# Patient Record
Sex: Male | Born: 1955 | Race: White | Hispanic: No | Marital: Married | Smoking: Former smoker
Health system: Southern US, Community
[De-identification: ages and names within clinical notes are randomized; demographics above are authoritative.]

## PROBLEM LIST (undated history)

## (undated) DIAGNOSIS — IMO0002 Reserved for concepts with insufficient information to code with codable children: Secondary | ICD-10-CM

## (undated) HISTORY — DX: Reserved for concepts with insufficient information to code with codable children: IMO0002

---

## 2006-07-21 ENCOUNTER — Encounter: Admission: RE | Admit: 2006-07-21 | Discharge: 2006-07-21 | Payer: Self-pay | Admitting: Family Medicine

## 2006-07-23 ENCOUNTER — Encounter: Admission: RE | Admit: 2006-07-23 | Discharge: 2006-07-23 | Payer: Self-pay | Admitting: Family Medicine

## 2006-09-10 ENCOUNTER — Encounter: Admission: RE | Admit: 2006-09-10 | Discharge: 2006-09-10 | Payer: Self-pay | Admitting: Interventional Radiology

## 2006-12-18 ENCOUNTER — Encounter: Admission: RE | Admit: 2006-12-18 | Discharge: 2006-12-18 | Payer: Self-pay | Admitting: Interventional Radiology

## 2007-01-09 ENCOUNTER — Encounter (HOSPITAL_BASED_OUTPATIENT_CLINIC_OR_DEPARTMENT_OTHER): Admission: RE | Admit: 2007-01-09 | Discharge: 2007-04-09 | Payer: Self-pay | Admitting: Surgery

## 2008-05-30 IMAGING — US US EXTREM LOW VENOUS BILAT
1 series · 1 of 1 positions shown · non-contrast
Comparison: none

CLINICAL DATA: 50-year-old male, right ankle venous ulceration with varicosities and hyperpigmentation.  Evaluate for venous insufficiency. 
BILATERAL LOWER EXTREMITY VENOUS DOPPLER ULTRASOUND:
TECHNIQUE: Gray-scale sonography with compression, as well as color and duplex Doppler ultrasound, were performed to evaluate the deep venous system from the level of the common femoral vein through the popliteal and proximal calf veins.

[Series 1: us extrem low venous bilat · 1 of 1 slices shown]
[im 1/1]
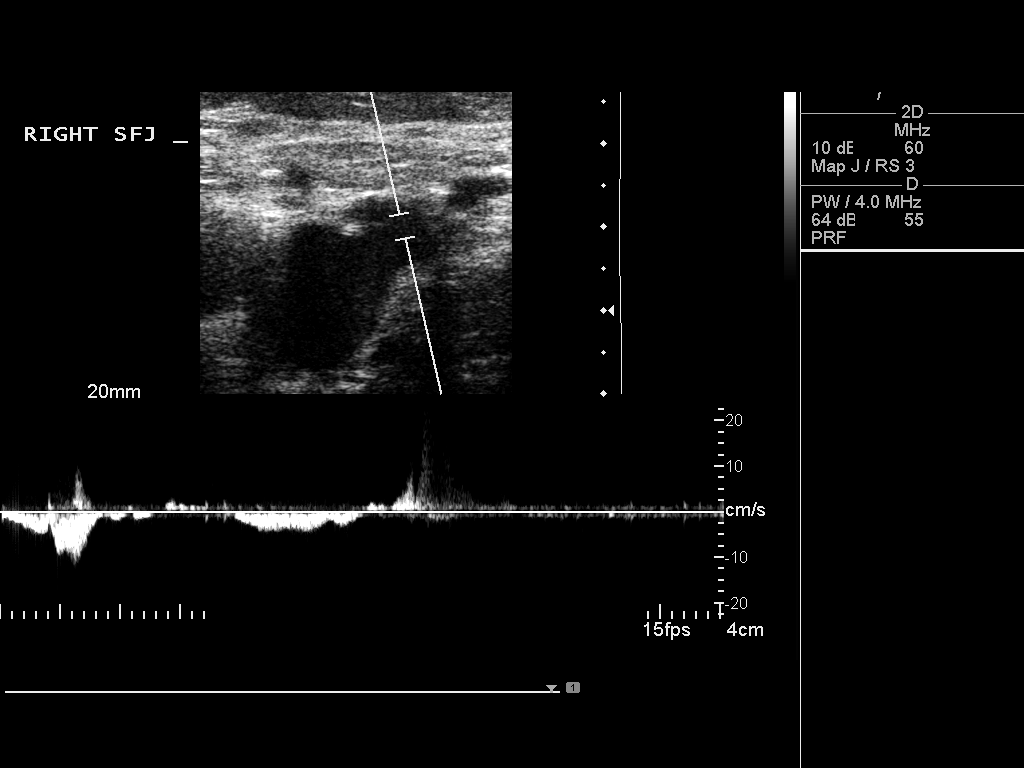

[1 of 1 positions shown; findings below may reference images not displayed]

FINDINGS: Left Lower Extremity:  The left common femoral, superficial femoral, and popliteal veins demonstrate normal compressibility and augmentation without DVT.  The left SFJ is very mildly positive with venous insufficiency.  The left GSV demonstrates mild diffuse venous insufficiency with no significant subsurface varicose veins.  The left SSV is normal.  
Right Lower Extremity:  The right common femoral, femoral, and popliteal veins demonstrate normal compressibility and augmentation without DVT.  The right SFJ is weakly positive with minimal reflux.  The right GSV is duplicated with intervening short segments of minor venous insufficiency.  In the lower calf region, there are small subsurface branching varicosities along the region of the focal punctate venous ulcer.  Hyperpigmentation is noted in this region.  Right SSV is normal.
IMPRESSION: 1.  Negative for DVT. 
2.  Very mild bilateral GSV venous insufficiency particularly in the right lower calf and ankle region correlating with the patient?s right ankle venous stasis ulcer.

## 2009-05-23 ENCOUNTER — Encounter: Admission: RE | Admit: 2009-05-23 | Discharge: 2009-06-15 | Payer: Self-pay | Admitting: Sports Medicine

## 2010-05-22 NOTE — Assessment & Plan Note (Signed)
[facility]   NAME:  Leonard Lambert, Leonard Lambert NO.:  [id number]   MEDICAL RECORD NO.:  [id number]      DATE OF BIRTH:  03-30-1955   PHYSICIAN:  Theresia Majors. Tanda Rockers, M.D.      VISIT DATE:                                   OFFICE VISIT   SUBJECTIVE:  Leonard Lambert is a 55 year old man whom we are following for a  right medial stasis ulcer.  We have treated him with compression and  Iodosorb.  There has been no interim drainage.  He has complained of  some mild pain and throbbing due to prolonged standing.  There has been  no fever.   OBJECTIVE:  Blood pressure is 141/86, respirations 16, pulse rate 81,  temperature is 98.3.  Inspection of the right medial ankle shows that the edema is well-  controlled.  The impacted Iodosorb has turned to a dull pale.  There is  no exudate.  The pedal pulses is readily palpable.  A 10 blade was used  to shave off the impacted Iodosorb.  This disclosed epithelialized base.   ASSESSMENT:  Clinical improvement of stasis ulcer.   PLAN:  We have instructed the patient to utilize his bilateral 30-40 mm  below-the-knee compression hose, continue to use Iodosorb if there is  any moisture, but to modify his dressing change to use Iodosorb every 3  days.  We will reevaluate him in 2 weeks p.r.n.  He will call the clinic  sooner if he experiences intense swelling, pain, fever or drainage.  We  have given him an opportunity to ask questions.  He seems to understand  indicates that he will be compliant.      Harold A. Tanda Rockers, M.D.  Electronically Signed     HAN/MEDQ  D:  02/23/2007  T:  02/23/2007  Job:  [id number]

## 2010-05-22 NOTE — Consult Note (Signed)
NAME:  Leonard Lambert, GUNNING NO.:  [id number]   MEDICAL RECORD NO.:  [id number]          PATIENT TYPE:  REC   LOCATION:  FOOT                         FACILITY:  [facility]   PHYSICIAN:  Theresia Majors. Tanda Rockers, M.D.DATE OF BIRTH:  1955/06/02   DATE OF CONSULTATION:  01/12/2007  DATE OF DISCHARGE:                                 CONSULTATION   REASON FOR CONSULTATION:  Leonard Lambert is a 55 year old man referred by Dr.  Venita Lick. Shick for evaluation of a venous stasis ulcer involving his right  medial leg.   IMPRESSION:  Stasis ulceration.   RECOMMENDATIONS:  Unna boot protocol followed by bilateral 30-40 mm open  toed compression hose.   SUBJECTIVE:  Leonard Lambert is a 55 year old male who has been followed for  the last 6 months for recurrent ulceration involving the medial aspect  of his right lower extremity.  He has had occasion for drainage, but no  malodor.  There has been mild redness and moderate pain prohibiting him  from free ambulation at the workplace.  There has been no recognized  trauma.  He has not been treated with antibiotics.  He has worn a  compressive hose that is approximately 68 months old.   PAST MEDICAL HISTORY:  Remarkable for the absence of medications.   ALLERGIES:  He denies allergies.   PAST SURGICAL HISTORY:  He denies previous surgery.   FAMILY HISTORY:  His family history is negative for diabetes,  hypertension, stroke and heart attack.  His father did have ulcers on  the lower extremities attributed to his veins.   SOCIAL HISTORY:  Socially, the patient is employed.  He lives in the  [place] area.  He has one adult daughter.  The patient has never  smoked.   REVIEW OF SYSTEMS:  His weight has been stable.  A 12-point, systems  review is completely negative.   PHYSICAL EXAMINATION:  VITAL SIGNS:  Blood pressure is 145/85,  respirations 16, pulse rate 72, temperature 98.3.  HEENT:  Exam is clear.  NECK:  The neck is supple.  Trachea is midline.   Thyroid is nonpalpable.  LUNGS:  Clear.  HEART:  The heart sounds were normal.  ABDOMEN:  The abdomen is soft.  EXTREMITIES:  Extremity exam is remarkable for bilateral edema 2+ on the  right, 1+ on the left.  There are prominent reticular varicosities with  mild ropiness in the greater saphenous distribution.  There are chronic  changes of hyperpigmentation and lipodermal sclerosis.  There is a well-  formed ulceration in the gaiter area on the medial aspect of the right  lower extremity.  There is a crusty exudate.  There is no ascending  cellulitis or lymphangitis.  The dorsalis pedis pulse is 3+ bilaterally.  The patient is Harris II.  NEUROLOGICALLY:  There is some decreased  sensation to the Semmes-Weinstein filament on the left volar foot.   DISCUSSION:  Leonard Lambert has a classic stasis ulcer.  We will treat him  with Unna boot protocol.  We have given him a prescription for bilateral  below-the-knee open  toed compression hose.  We anticipate that he should  experience complete resolution in 2-3 weeks and should do well with the  wearing of the appropriately fitted hose.  We have discussed his  diagnosis, his chronic management.  We have also discussed progression  of his disease which may require endovascular or open surgery.  We have  given the patient opportunity to ask questions.  He seems to understand,  expresses gratitude for having been seen in the clinic.      Harold A. Tanda Rockers, M.D.  Electronically Signed     HAN/MEDQ  D:  01/12/2007  T:  01/12/2007  Job:  [id number]   cc:   M. Trevor Shick  Caryn Bee L. Little, M.D.  Pam Drown, M.D.

## 2010-05-22 NOTE — Assessment & Plan Note (Signed)
[facility]   NAME:  MOISHY, LADAY NO.:  [id number]   MEDICAL RECORD NO.:  [id number]      DATE OF BIRTH:  Jun 26, 1955   PHYSICIAN:  Theresia Majors. Tanda Rockers, M.D. VISIT DATE:  01/26/2007                                   OFFICE VISIT   SUBJECTIVE:  Leonard Lambert is a 56 year old man who we are treating for a  stasis ulcer.  In the interim, we placed him in an Radio broadcast assistant.  The  patient reports that he removed the Unna boot 3 days ago  because it  became too tight.   OBJECTIVE:  VITAL SIGNS:  Blood pressure is 137/82, respirations 76,  temperature is not recorded, pulse rate of 82.  EXTREMITIES:  Inspection of the right medial ankle shows that there has  been contraction of the ulcer.  There is persistent 2+ edema with  chronic changes of stasis.  The pedal pulse is 3+.  There is no evidence  of wrap injury.  The wound was photographed.  Please refer to the data  entry.   ASSESSMENT:  Clinical improvement of stasis ulcer.   PLAN:  We will treat we are substituting a Profore wrap with  triamcinolone cream in the place of the Unna wrap.  We have reemphasized  the necessity for compliance.  We have encouraged the patient to remove  the wrap if it becomes excessively tight.  The patient was given an  opportunity to ask questions.  He seems to understand indicates that he  will be compliant.  We will reevaluate him in 1 week.      Harold A. Tanda Rockers, M.D.  Electronically Signed     HAN/MEDQ  D:  01/26/2007  T:  01/26/2007  Job:  [id number]

## 2010-05-22 NOTE — Assessment & Plan Note (Signed)
[facility]   NAME:  Leonard Lambert, Leonard Lambert NO.:  [id number]   MEDICAL RECORD NO.:  [id number]      DATE OF BIRTH:  Apr 13, 1955   PHYSICIAN:  Theresia Majors. Tanda Rockers, M.D. VISIT DATE:  02/27/2007                                   OFFICE VISIT   SUBJECTIVE:  Leonard Lambert returns for follow-up of a stasis ulcer on his  medial right ankle.  We have treated him with compression wrap.  He  continues to be ambulatory.  There has been no excessive pain, malodor  or fever.   OBJECTIVE:  VITALS:  Blood pressure is 145/83, respirations 16, pulse  rate 70, temperature 98.3.  Inspection of the right medial ankle shows  that the ulcer has decreased in area and volume.  There is no evidence  of excessive erythema.  There is no frank infection, abscess or  ischemia.  Compression has been satisfactory.   ASSESSMENT:  Clinical response external compression.   PLAN:  We will apply topical iodosorb and a Profore wrap and reevaluate  the patient in 1 week.      Harold A. Tanda Rockers, M.D.  Electronically Signed     HAN/MEDQ  D:  02/09/2007  T:  02/09/2007  Job:  [id number]

## 2010-05-22 NOTE — Assessment & Plan Note (Signed)
[facility]   NAME:  Leonard Lambert, Leonard Lambert NO.:  [id number]   MEDICAL RECORD NO.:  [id number]      DATE OF BIRTH:  [birth year]   PHYSICIAN:  Theresia Majors. Tanda Lambert, M.D. VISIT DATE:  01/19/2007                                   OFFICE VISIT   SUBJECTIVE:  Leonard Lambert is a 55 year old man who we are treating for  stasis ulcer involving the medial malleolus of the right lower  extremity.  In the interim the patient has modified the wrap by taking  it off, applying topical antibiotic and reapplying it.  He continues to  be ambulatory. There has been no fever, excessive drainage or malodor.   OBJECTIVE:  VITALS:  Blood pressure is 141/80, respirations 16, pulse  rate 82, temperature is 98.3.  EXTREMITIES:  Inspection of the lower extremity shows that the edema is  reasonably controlled as manifested by lineal wrinkles in the skin.  The  wrap has been inappropriately applied in the gaiter area and there is  significant constriction without full-thickness injury.  The pedal pulse  remains readily palpable.  There is no evidence of a ascending  infection.  The wound itself shows significant decrease in diameter.  No  debridement is needed.   ASSESSMENT:  Clinical improvement.   PLAN:  We have instructed the patient in the use of the compression wrap  therapy.  We have specifically advised him against disturbing in the  wrap except as outlined in his initial visit.  The wrap is to be  nonpainful, it is feel as tight as a firm handshake.  If he experiences  undue tightness, he is to elevate his leg for 15 minutes and if it the  tightness and discomfort is not completely resolved, he is to completely  remove the wrap, call the clinic for an appointment.  He has been  specifically advised against modifying the wrap and reapply it as this  is fraught with the possibility of wrap injury and worsening the  ulceration.  He has not procured his 30-40 mm below-the-knee  support  hose.  We have confirmed that he does have the prescription.  We have  instructed him to bring the hose with him during his next visit as we  anticipate he should be completely resolved.  We have given the patient  opportunity to ask questions.  He seems to understand and indicates that  he will be compliant.  We will reevaluate him in 1 week.      Leonard Lambert, M.D.  Electronically Signed     HAN/MEDQ  D:  01/19/2007  T:  01/19/2007  Job:  [id number]

## 2010-05-22 NOTE — Assessment & Plan Note (Signed)
[facility]   NAME:  Leonard, Lambert NO.:  [id number]   MEDICAL RECORD NO.:  [id number]      DATE OF BIRTH:  [birth year]   PHYSICIAN:  Theresia Majors. Tanda Lambert, M.D. VISIT DATE:  03/17/2007                                   OFFICE VISIT   SUBJECTIVE:  Leonard Lambert is a 55 year old man who has been followed in the  Wound Center for stasis ulcer involving his right medial ankle.  In the  interim we have treated him with sequential debridements and  compression.  He returns for followup.  There has been no interim  drainage malodor pain or fever.   OBJECTIVE:  Blood pressure is 134/94, respirations 16, pulse rate 71,  temperature is 98.9.  The inspection of the right medial ankle shows that the wound is 100%  resolved.  There is no evidence of residual.  There is good perfusion.  There is +2 edema of the right lower extremity associated with chronic  changes of stasis.   ASSESSMENT:  Satisfactory resolution of stasis ulcer.   PLAN:  We will discharge the patient from active management in the Wound  Center.  We have encouraged him to procure and wear bilateral below-the-  knee 30-40 mm open toed compression hose.   The patient is discharged.      Leonard Lambert, M.D.  Electronically Signed     HAN/MEDQ  D:  03/17/2007  T:  03/18/2007  Job:  [id number]   cc:   M. Trevor Shick  Caryn Bee L. Little, M.D.

## 2010-05-22 NOTE — Assessment & Plan Note (Signed)
[facility]   NAME:  Leonard Lambert, POPPER NO.:  [id number]   MEDICAL RECORD NO.:  [id number]      DATE OF BIRTH:  05/27/1955   PHYSICIAN:  Theresia Majors. Tanda Rockers, M.D. VISIT DATE:  02/03/2007                                   OFFICE VISIT   SUBJECTIVE:  Mr. Rowser is a 55 year old man whom we are following for  stasis ulcer.  In the interim we have treated him with a Profore wrap,  triamcinolone ointment and elevation.  He continues to be ambulatory.  There has been no interim fever, excessive drainage malodor or pain.   OBJECTIVE:  Blood pressure is 145/90, respirations 18, pulse rate 73,  temperature 98.5.  Inspection of the right medial ankle shows that the ulcer has a moderate  amount of desquamation and necrotic tissue. Under EMLA block an  excisional debridement of necrotic tissue, desquamation, and a rim of  subcutaneous tissue was excised disclosing a healthy wound underneath.  Hemorrhage was controlled with direct pressure.  The patient was  returned to a Profore wrap.   ASSESSMENT:  Clinical improvement of stasis ulcer adequately debrided.   PLAN:  We will continue compression wrap therapy and reevaluate the  patient in 1 week.      Harold A. Tanda Rockers, M.D.  Electronically Signed     HAN/MEDQ  D:  02/03/2007  T:  02/03/2007  Job:  [id number]

## 2010-05-22 NOTE — Assessment & Plan Note (Signed)
[facility]   NAME:  STELLA, ENCARNACION NO.:  [id number]   MEDICAL RECORD NO.:  [id number]      DATE OF BIRTH:  1955/03/24   PHYSICIAN:  Theresia Majors. Tanda Rockers, M.D. VISIT DATE:  02/16/2007                                   OFFICE VISIT   SUBJECTIVE:  Mr. Witkop is a 55 year old man who we are following for  stasis ulcer. In the interim we have treated him with an Unna wrap and  iodosorb gel topically.  There has been no pain, fever or malodor.  He  continues to be ambulatory.   OBJECTIVE:  VITALS:  Blood pressure is 136/87, respirations 18, pulse  rate 69, temperature 97.9.  SKIN:  Inspection of the wound shows that there is significant decrease  in area and volume.  The impacted iodosorb was removed disclosing  advancing epithelium.  The pedal pulse remains palpable.   ASSESSMENT:  Clinically improved stasis ulcer.   PLAN:  We will return the patient to an Radio broadcast assistant with iodosorb.  We  will reevaluate him in 1 week.      Harold A. Tanda Rockers, M.D.  Electronically Signed     HAN/MEDQ  D:  02/16/2007  T:  02/16/2007  Job:  [id number]

## 2010-07-04 ENCOUNTER — Encounter (HOSPITAL_BASED_OUTPATIENT_CLINIC_OR_DEPARTMENT_OTHER): Payer: Managed Care, Other (non HMO) | Attending: Plastic Surgery

## 2010-07-04 DIAGNOSIS — I872 Venous insufficiency (chronic) (peripheral): Secondary | ICD-10-CM | POA: Insufficient documentation

## 2010-07-04 DIAGNOSIS — I8390 Asymptomatic varicose veins of unspecified lower extremity: Secondary | ICD-10-CM | POA: Insufficient documentation

## 2010-07-04 DIAGNOSIS — L97809 Non-pressure chronic ulcer of other part of unspecified lower leg with unspecified severity: Secondary | ICD-10-CM | POA: Insufficient documentation

## 2010-07-04 DIAGNOSIS — I739 Peripheral vascular disease, unspecified: Secondary | ICD-10-CM | POA: Insufficient documentation

## 2010-07-05 NOTE — Assessment & Plan Note (Signed)
[facility]  NAME:  Leonard Lambert, Leonard Lambert NO.:  [id number]  MEDICAL RECORD NO.:  [id number]      DATE OF BIRTH:  1955-07-03  PHYSICIAN:  Wayland Denis, DO       VISIT DATE:  07/04/2010                                  OFFICE VISIT   Mr. Leonard Lambert is a 55 year old white male who is here for evaluation of a right lower extremity ulcer.  He was seen here several years ago and has long-term venous insufficiency.  The wound was in the same place and healed up.  He has been on his legs a lot working which may have contributed to the skin breakdown at this time.  His medical history is not significantly changed and he has been using a little antibiotic ointment with some sort of a cleaning solution without improvement.  He was concerned that it might get worse, so call for another visit.  PAST MEDICAL HISTORY:  Known peripheral vascular disease.  MEDICATIONS:  None.  ALLERGIES:  None.  SURGICAL HISTORY:  None significant.  REVIEW OF SYSTEMS:  He denies any significant change in his weight, vision, hearing, difficulty breathing, chest pain or significant change in his social or mental status.  On exam; he is alert, oriented, and cooperative in no acute distress. He is a very pleasant.  His pupils are equal and reactive.  His extraocular muscles are intact.  No cervical lymphadenopathy.  His lungs are clear.  His heart is regular.  His abdomen is soft.  He does have good peripheral pulses, but very bad varicose veins throughout lower extremities.  The ulcers on the right medial aspect close to his medial malleolus, it does not appear to be infected but has some fibrous tissue.  Further description noted in the nurse's notes.  ASSESSMENT:  Right lower extremity ulcer.  PLAN:  For vascular studies.  Lab work to see his prealbumin level. Recommend elevation, compression and Santyl daily with shower.  He is going on vacation for a week, so he did not  really want an Barista.  I gave him a script for the [facility] and we will see him back in followup in 2 weeks    Wayland Denis, DO    CS/MEDQ  D:  07/04/2010  T:  07/05/2010  Job:  [id number]

## 2010-07-18 ENCOUNTER — Encounter (HOSPITAL_BASED_OUTPATIENT_CLINIC_OR_DEPARTMENT_OTHER): Payer: Managed Care, Other (non HMO) | Attending: Plastic Surgery

## 2010-07-18 DIAGNOSIS — I739 Peripheral vascular disease, unspecified: Secondary | ICD-10-CM | POA: Insufficient documentation

## 2010-07-18 DIAGNOSIS — L97809 Non-pressure chronic ulcer of other part of unspecified lower leg with unspecified severity: Secondary | ICD-10-CM | POA: Insufficient documentation

## 2010-07-18 DIAGNOSIS — I8390 Asymptomatic varicose veins of unspecified lower extremity: Secondary | ICD-10-CM | POA: Insufficient documentation

## 2010-07-18 DIAGNOSIS — I872 Venous insufficiency (chronic) (peripheral): Secondary | ICD-10-CM | POA: Insufficient documentation

## 2010-07-18 NOTE — Progress Notes (Signed)
[facility]  NAME:  Leonard Lambert, Leonard Lambert NO.:  [id number]  MEDICAL RECORD NO.:  [id number]      DATE OF BIRTH:  1955/04/19  PHYSICIAN:  Wayland Denis, DO            VISIT DATE:                                  OFFICE VISIT   Leonard Lambert is a 55 year old white male with a right lower extremity ulcer. He does not have diabetes.  He has been using Santyl and is improving. He still has some swelling but the wound is not deep or large.  No sign of infection.  Overall he is pleased with the progress.  He went to the beach and got fair bit of sun on his lower extremities but there has been no change in his social or medical history to date.  PHYSICAL EXAMINATION:  GENERAL:  He is alert and oriented, cooperative in no acute distress. EYES:  His pupils were equal and reactive. Extraocular muscles are intact. CERVICAL:  No lymphadenopathy. LUNGS:  Clear. HEART:  Regular. ABDOMEN:  Soft.  Lower extremity wound as described.  We will continue with the Santyl, add collagen, continue with elevation and we will get him some new stockings, so he can continue with compression.     Wayland Denis, DO     CS/MEDQ  D:  07/18/2010  T:  07/18/2010  Job:  [id number]

## 2010-07-19 ENCOUNTER — Encounter (INDEPENDENT_AMBULATORY_CARE_PROVIDER_SITE_OTHER): Payer: Managed Care, Other (non HMO)

## 2010-07-19 DIAGNOSIS — L97509 Non-pressure chronic ulcer of other part of unspecified foot with unspecified severity: Secondary | ICD-10-CM

## 2010-07-25 NOTE — Progress Notes (Signed)
[facility]  NAME:  Leonard Lambert, Leonard Lambert NO.:  [id number]  MEDICAL RECORD NO.:  [id number]      DATE OF BIRTH:  [birth year]  PHYSICIAN:  Wayland Denis, DO       VISIT DATE:  07/25/2010                                  OFFICE VISIT   Leonard Lambert is a 55 year old white male here for followup on his right lower extremity wound that is located on the medial aspect of right malleolus.  He has been using Santyl.  He did note for vascular studies, which showed deep vein insufficiency.  There is some not whole lot of a change, little improvement.  The swelling has improved.  He has been wearing compression.  He has not started his multivitamins.  There has been no change in his medications or review of systems.  PHYSICAL EXAMINATION:  GENERAL:  He is alert, oriented, and cooperative, in no acute distress.  He is very pleasant. HEENT:  Pupils are equal.  Extraocular muscles are intact. NECK:  No cervical lymphadenopathy. LUNGS:  Clear. HEART:  Regular. ABDOMEN:  Soft. EXTREMITIES:  He still has some swelling in his leg.  The wound is small, very slight improvement.  No sign of infection.  He does have some tenderness just superior and posterior to the wound which is related to his pain and I can feel the difference.  We will send him for a consult to be sure there is anything that we can do about these wounds and we will see him back in a week.  He will continue with the Santyl.     Wayland Denis, DO     CS/MEDQ  D:  07/25/2010  T:  07/25/2010  Job:  [id number]

## 2010-07-31 NOTE — Procedures (Unsigned)
DUPLEX DEEP VENOUS EXAM - LOWER EXTREMITY  INDICATION:  Right lower extremity ulcer.  HISTORY:  Edema:  Yes. Trauma/Surgery:  No. Pain:  No. PE:  No. Previous DVT:  No. Anticoagulants:  No. Other:  DUPLEX EXAM:               CFV   SFV   PopV  PTV    GSV               R  L  R  L  R  L  R   L  R  L Thrombosis    o  o  o  o  o  o  o   o  o  o Spontaneous   +  +  +  +  +  +  +   +  +  + Phasic        +  +  +  +  +  +  +   +  +  + Augmentation  +  +  +  +  +  +  +   +  +  + Compressible  +  +  +  +  +  +  +   +  +  + Competent     o  o  o  o  o  o  o   o  o  o  Legend:  + - yes  o - no  p - partial  D - decreased  IMPRESSION: 1. No evidence of deep venous thrombosis. 2. Positive for clinically significant deep vein insufficiency.   _____________________________ Fransisco Hertz, MD  EM/MEDQ  D:  07/19/2010  T:  07/19/2010  Job:  [id number]

## 2010-08-01 NOTE — Progress Notes (Signed)
[facility]  NAME:  Leonard Lambert, Leonard Lambert                       ACCOUNT NO.:  MEDICAL RECORD NO.:  [id number]      DATE OF BIRTH:  1955-03-28  PHYSICIAN:  Wayland Denis, DO            VISIT DATE:                                  OFFICE VISIT   Leonard Lambert is a 55 year old white male who is here for followup on his right medial ankle who has been using Santyl.  There has not been much change.  He has been good about the compression hose, which helped with the swelling some, but he is a little discourage that there has been no significant change in the size of the wound.  It is no worse, but not any better.  There has been no change in his medications or social history.  REVIEW OF SYSTEMS:  Negative.  PHYSICAL EXAMINATION:  GENERAL:  He is alert, oriented, and cooperative. HEENT:  His pupils are equal.  Extraocular muscles are intact. NECK:  No cervical lymphadenopathy. LUNGS:  Clear. HEART:  Regular. ABDOMEN:  Soft. EXTREMITIES:  His lower extremity wound is not significantly changed.  We are going to add an Unna boot to the [place] and I have instructed him to let us know if it gets loose, but otherwise we will see him back in 1 week.     Wayland Denis, DO     CS/MEDQ  D:  08/01/2010  T:  08/01/2010  Job:  [id number]

## 2010-08-08 ENCOUNTER — Encounter (HOSPITAL_BASED_OUTPATIENT_CLINIC_OR_DEPARTMENT_OTHER): Payer: Managed Care, Other (non HMO) | Attending: Plastic Surgery

## 2010-08-08 DIAGNOSIS — L97309 Non-pressure chronic ulcer of unspecified ankle with unspecified severity: Secondary | ICD-10-CM | POA: Insufficient documentation

## 2010-08-08 DIAGNOSIS — L97809 Non-pressure chronic ulcer of other part of unspecified lower leg with unspecified severity: Secondary | ICD-10-CM | POA: Insufficient documentation

## 2010-08-08 NOTE — Progress Notes (Signed)
[facility]  NAME:  Leonard Lambert, Leonard Lambert NO.:  [id number]  MEDICAL RECORD NO.:  [id number]      DATE OF BIRTH:  12-03-1955  PHYSICIAN:  Wayland Denis, DO            VISIT DATE:                                  OFFICE VISIT   Leonard Lambert has a right leg ulcer that we have been treating with Santyl and Unna boot.  He has actually had some improvement, but it has been mild.  The vascular studies again showed deep venous insufficiency. There has been no change in his medications or social history and no recent illness.  PHYSICAL EXAMINATION:  GENERAL:  He is alert and oriented, cooperative, in no acute distress. HEENT:  His pupils are equal and reactive.  Extraocular muscles are intact. LYMPH:  No cervical lymphadenopathy. LUNGS:  Clear. HEART:  Regular.  Wound mild improvement.  No significant closure, veins prominent, specific size noted in chart.  Plan for Prisma, a Tielle with stocking at home and consult for further evaluation on his deep system.     Wayland Denis, DO     CS/MEDQ  D:  08/08/2010  T:  08/08/2010  Job:  [id number]

## 2010-08-22 NOTE — Progress Notes (Signed)
[facility]  NAME:  EVANGELOS, PAULINO NO.:  [id number]  MEDICAL RECORD NO.:  [id number]      DATE OF BIRTH:  1955/12/21  PHYSICIAN:  Wayland Denis, DO       VISIT DATE:  08/22/2010                                  OFFICE VISIT   Mr. Golladay is a 55 year old white male here for follow up on the right medial ankle ulcer.  He has been using Prisma TL with a stocking.  He has had very good improvement and the actual ulcer is nearly gone with a pinpoint opening that is very superficial.  Just superior to that, he has some cracking that is tender likely due to the dryness and he wanted to be sure there not was something more he should be doing about this. He has not had any fevers or sign of infection.  There is no change in his medications.  On exam, he is alert, oriented, and cooperative in no acute distress. He is pleasant.  Pupils are equal.  Extraocular muscles are intact.  His breathing is unlabored.  Heart is regular.  The wounds as described and no significant change from previously, it is noted in the nurse's note.  We will continue with the stocking, but add SilverCel and an alginate with moisturizer for the rest of the leg and have him follow up as needed.  He is in agreement with this plan.     Wayland Denis, DO     CS/MEDQ  D:  08/22/2010  T:  08/22/2010  Job:  [id number]

## 2010-08-23 ENCOUNTER — Encounter: Payer: Self-pay | Admitting: Vascular Surgery

## 2010-08-24 ENCOUNTER — Encounter: Payer: Self-pay | Admitting: Vascular Surgery

## 2010-08-28 ENCOUNTER — Ambulatory Visit (INDEPENDENT_AMBULATORY_CARE_PROVIDER_SITE_OTHER): Payer: Managed Care, Other (non HMO) | Admitting: Vascular Surgery

## 2010-08-28 ENCOUNTER — Other Ambulatory Visit (INDEPENDENT_AMBULATORY_CARE_PROVIDER_SITE_OTHER): Payer: Managed Care, Other (non HMO)

## 2010-08-28 ENCOUNTER — Encounter: Payer: Self-pay | Admitting: Vascular Surgery

## 2010-08-28 VITALS — BP 155/86 | HR 77 | Resp 20 | Ht 73.0 in | Wt 205.0 lb

## 2010-08-28 DIAGNOSIS — L97919 Non-pressure chronic ulcer of unspecified part of right lower leg with unspecified severity: Secondary | ICD-10-CM

## 2010-08-28 DIAGNOSIS — I83219 Varicose veins of right lower extremity with both ulcer of unspecified site and inflammation: Secondary | ICD-10-CM

## 2010-08-28 DIAGNOSIS — I83009 Varicose veins of unspecified lower extremity with ulcer of unspecified site: Secondary | ICD-10-CM

## 2010-08-28 NOTE — Progress Notes (Signed)
Subjective:     Patient ID: Leonard Lambert, male   DOB: [birth year], 55 y.o.   MRN: [id number]  HPI this 55 year old male was referred for a stasis ulcer in the right ankle. He has been treated at the [facility] long wound center for the past 6 weeks. This has included an Radio broadcast assistant treatment. He had a previous ulcer 2 years ago which was successfully treated with an Radio broadcast assistant. He has no history of DVT, thrombophlebitis, bleeding, or bulging varicose veins. He did develop darkening of the skin in the right ankle several years ago which has progressed. He wears short leg plastic compression stockings frequently. He has minimal symptoms in the contralateral left leg. Both legs have pain aching and throbbing as the day goes by worse right than left.  Past Medical History  Diagnosis Date  . Ulcer     History  Substance Use Topics  . Smoking status: Not on file  . Smokeless tobacco: Not on file  . Alcohol Use:     No family history on file.  No Known Allergies  No current outpatient prescriptions on file.  Filed Vitals:   08/28/10 1106  Height: 6\' 1"  (1.854 m)  Weight: 205 lb (92.987 kg)    Body mass index is 27.05 kg/(m^2).        Review of Systems patient denies any chest pain dyspnea on exertion PND orthopnea chronic bronchitis lower extremity claudication or other symptoms. His review of systems is completely negative in all systems.     Objective:   Physical Exam blood pressure 1 majority 6 heart rate 77 respirations 20 General he is well-developed well-nourished male no apparent distress alert and oriented x3 HEENT exam is normal for age EOMs intact Chest negative for wheezing or rhonchi Cardiovascular regular rhythm no murmurs carotid pulses 3+ no bruits Abdomen soft nontender no masses Skin has 2 discrete ulcers in the right lower third of the right calf near the medial malleolus. One measures about 2 cm in diameter and the more proximal one about 1 cm in diameter. There is  hyperpigmentation over the lower third of the medial ankle with thickening of the skin. There is 1+ edema in the right ankle. There are multiple reticular and spider veins in the lower leg on the right more so than the left. There is 3+ femoral popliteal and dorsalis pedis pulses palpable bilaterally.     Assessment:     Today I ordered a venous duplex exam of the right leg which are reviewed and interpreted. There is reflux in the deep system throughout. There is reflux in the great saphenous system from the mid calf to the saphenofemoral junction. There is reflux in the right small saphenous vein. This patient has a classic venous stasis ulcer secondary to a combination of deep and superficial venous reflux. This is a recurrent ulcer which was sealed successfully 2 years ago. The patient has been wearing elastic compression stockings and trying elevation and ibuprofen with no success.    Plan:     We should proceed with #1 laser ablation of right great saphenous vein #2 laser oblation of right small saphenous vein Hopefully this will facilitate healing of the stasis ulcers in the right ankle and help prevent further stasis ulcers. We will proceed with precertification to perform this in the near future.

## 2010-08-28 NOTE — Progress Notes (Signed)
Non healing ulcer right inner ankle.

## 2010-09-04 NOTE — Procedures (Unsigned)
LOWER EXTREMITY VENOUS REFLUX EXAM  INDICATION:  Right lower extremity venous wound.  EXAM:  Using color-flow imaging and pulse Doppler spectral analysis, the right great and small saphenous veins were evaluated.  The right saphenofemoral junction is not competent with reflux of >500 milliseconds.  The right GSV is not competent with reflux of >500 milliseconds with the caliber as described below.  The right proximal small saphenous vein demonstrates competency, however, the mid and distal portion are incompetent with multiple branches communicating with the great saphenous vein.  GSV Diameter (used if found to be incompetent only)                                           Right    Left Proximal Greater Saphenous Vein           0.53 cm  cm Proximal-to-mid-thigh                     0.23 cm  cm Mid thigh                                 0.21 cm  cm Mid-distal thigh                          cm       cm Distal thigh                              0.31 cm  cm Knee                                      0.42 cm  cm   IMPRESSION: 1. Right great saphenous vein is not competent with reflux of >500     milliseconds. 2. The right great saphenous vein is not tortuous. 3. The deep venous system was evaluated on a previous report. 4. The right small saphenous vein is not competent with reflux of >500     milliseconds. 5. Please see attached worksheet for details.            ___________________________________________ Quita Skye Hart Rochester, M.D.  LT/MEDQ  D:  08/28/2010  T:  08/28/2010  Job:  [id number]

## 2010-09-12 ENCOUNTER — Encounter (HOSPITAL_BASED_OUTPATIENT_CLINIC_OR_DEPARTMENT_OTHER): Payer: Managed Care, Other (non HMO)

## 2010-11-12 ENCOUNTER — Other Ambulatory Visit: Payer: Self-pay | Admitting: *Deleted

## 2010-11-12 DIAGNOSIS — I83893 Varicose veins of bilateral lower extremities with other complications: Secondary | ICD-10-CM

## 2010-11-16 ENCOUNTER — Encounter: Payer: Self-pay | Admitting: Vascular Surgery

## 2010-11-19 ENCOUNTER — Encounter: Payer: Self-pay | Admitting: Vascular Surgery

## 2010-11-19 ENCOUNTER — Ambulatory Visit (INDEPENDENT_AMBULATORY_CARE_PROVIDER_SITE_OTHER): Payer: Managed Care, Other (non HMO) | Admitting: Vascular Surgery

## 2010-11-19 VITALS — BP 149/90 | HR 83 | Resp 18 | Ht 73.0 in | Wt 211.5 lb

## 2010-11-19 DIAGNOSIS — L97929 Non-pressure chronic ulcer of unspecified part of left lower leg with unspecified severity: Secondary | ICD-10-CM

## 2010-11-19 DIAGNOSIS — I83219 Varicose veins of right lower extremity with both ulcer of unspecified site and inflammation: Secondary | ICD-10-CM

## 2010-11-19 NOTE — Progress Notes (Signed)
Laser Ablation Procedure      Date: 11/19/2010    Leonard Lambert DOB:1955-10-05  Consent signed: Yes  Surgeon:J.D. Hart Rochester  Procedure: Laser Ablation: right Greater Saphenous Vein  BP 149/90  Pulse 83  Resp 18  Ht 6\' 1"  (1.854 m)  Wt 211 lb 8 oz (95.936 kg)  BMI 27.90 kg/m2  Start time: 1:30PM   End time: 2 :25 PM  Tumescent Anesthesia: 450 cc 0.9% NaCl with 50 cc Lidocaine HCL with 1% Epi and 15 cc 8.4% NaHCO3  Local Anesthesia: 2 cc Lidocaine HCL and NaHCO3 (ratio 2:1)   Pulsed mode 15 watts  1 second 1 pulse   total pulses 133   Total energy 1956 joules  Total time 2:10       Patient tolerated procedure well: Yes  Rankin, [name]  Description of Procedure:  After marking the course of the saphenous vein and the secondary varicosities in the standing position, the patient was placed on the operating table in the supine position, and the right leg was prepped and draped in sterile fashion. Local anesthetic was administered, and under ultrasound guidance the saphenous vein was accessed with a micro needle and guide wire; then the micro puncture sheath was placed. A guide wire was inserted to the saphenofemoral junction, followed by a 5 french sheath.  The position of the sheath and then the laser fiber below the junction was confirmed using the ultrasound and visualization of the aiming beam.  Tumescent anesthesia was administered along the course of the saphenous vein using ultrasound guidance. Protective laser glasses were placed on the patient, and the laser was fired at 15 watt pulsed mode advancing 1-2 mm per sec.  For a total of 1956 joules.  A steri strip was applied to the puncture site.   ABD pads and thigh high compression stockings were applied.  Ace wrap bandages were applied   at the top of the saphenofemoral junction.  Blood loss was less than 15 cc.  The patient ambulated out of the operating room having tolerated the procedure well.

## 2010-11-19 NOTE — Progress Notes (Signed)
Subjective:     Patient ID: Leonard Lambert, male   DOB: 1955-03-07, 55 y.o.   MRN: [id number]  HPI this 55 year old male patient underwent laser ablation great saphenous vein for valvular incompetence and venous hypertension with resultant stasis ulcer right ankle. This was done under local tumescent anesthesia with a total usage of 1956 J of power. The patient tolerated the procedure well   Review of Systems     Objective:   Physical ExamBP 149/90  Pulse 83  Resp 18  Ht 6\' 1"  (1.854 m)  Wt 211 lb 8 oz (95.936 kg)  BMI 27.90 kg/m2    Assessment:     Well-tolerated laser ablation right great saphenous vein to treat venous stasis ulcer.    Plan:     Return in one week for venous duplex exam right leg to confirm closure right great saphenous vein

## 2010-11-20 ENCOUNTER — Other Ambulatory Visit: Payer: Self-pay | Admitting: *Deleted

## 2010-11-20 DIAGNOSIS — I83893 Varicose veins of bilateral lower extremities with other complications: Secondary | ICD-10-CM

## 2010-11-23 ENCOUNTER — Encounter: Payer: Self-pay | Admitting: Vascular Surgery

## 2010-11-26 ENCOUNTER — Ambulatory Visit (INDEPENDENT_AMBULATORY_CARE_PROVIDER_SITE_OTHER): Payer: 59 | Admitting: Vascular Surgery

## 2010-11-26 ENCOUNTER — Encounter: Payer: Self-pay | Admitting: Vascular Surgery

## 2010-11-26 ENCOUNTER — Ambulatory Visit: Payer: 59 | Admitting: Vascular Surgery

## 2010-11-26 VITALS — BP 154/92 | HR 108 | Resp 20 | Ht 73.0 in | Wt 210.0 lb

## 2010-11-26 DIAGNOSIS — I83893 Varicose veins of bilateral lower extremities with other complications: Secondary | ICD-10-CM

## 2010-11-26 DIAGNOSIS — M7989 Other specified soft tissue disorders: Secondary | ICD-10-CM

## 2010-11-26 DIAGNOSIS — M79609 Pain in unspecified limb: Secondary | ICD-10-CM

## 2010-11-26 DIAGNOSIS — Z48812 Encounter for surgical aftercare following surgery on the circulatory system: Secondary | ICD-10-CM

## 2010-11-26 NOTE — Progress Notes (Signed)
RLE venous post ablation duplex on 11/26/2010 @ [facility]

## 2010-11-26 NOTE — Progress Notes (Signed)
Patient came for his one week post laser ablation duplex study. His R GSV is closed. He has minimal bruising and discomfort. I explained to him that Dr. Hart Rochester had to leave early today because of a personal emergency. Patient understood. Instructed him to continue wearing his compression stocking for one more week and to discontinue the Ibuprofen 600mg . He can take 400 mg if he needs something for pain relief. We will see him on 12/24/10 for his next procedure.

## 2010-12-04 ENCOUNTER — Other Ambulatory Visit: Payer: Self-pay

## 2010-12-04 NOTE — Procedures (Unsigned)
DUPLEX DEEP VENOUS EXAM - LOWER EXTREMITY  INDICATION:  Varicose veins, right lower extremity pain and swelling  HISTORY:  Edema:  Yes Trauma/Surgery:  Right endovenous laser ablation on 11/19/2010 Pain:  Yes PE:  No Previous DVT:  No Anticoagulants:  No Other:  DUPLEX EXAM:               CFV   SFV   PopV  PTV    GSV               R  L  R  L  R  L  R   L  R  L Thrombosis    o  o  o     o     o      + Spontaneous   +  +  +     +     +      o Phasic        +  +  +     +     +      o Augmentation  +  +  +     +     +      o Compressible  +  +  +     +     +      o Competent     +  o  o     o     o  Legend:  + - yes  o - no  p - partial  D - decreased  IMPRESSION: 1. No evidence of deep vein thrombosis identified involving the right     lower extremity. 2. Deep venous reflux present involving the right superficial femoral,     popliteal and one of two  posterior tibial vein. 3. Good post ablation results the length of the right great saphenous     vein ablated, no extension of thrombus is identified at the     saphenofemoral junction. 4. Contralateral common femoral vein is patent with reflux present.   _____________________________ Quita Skye. Hart Rochester, M.D.  SH/MEDQ  D:  11/26/2010  T:  11/26/2010  Job:  [id number]

## 2010-12-17 ENCOUNTER — Encounter: Payer: Self-pay | Admitting: Vascular Surgery

## 2010-12-21 ENCOUNTER — Encounter: Payer: Self-pay | Admitting: Vascular Surgery

## 2010-12-24 ENCOUNTER — Encounter: Payer: Self-pay | Admitting: Vascular Surgery

## 2010-12-24 ENCOUNTER — Ambulatory Visit (INDEPENDENT_AMBULATORY_CARE_PROVIDER_SITE_OTHER): Payer: 59 | Admitting: Vascular Surgery

## 2010-12-24 VITALS — BP 160/88 | HR 80 | Resp 20 | Ht 73.0 in | Wt 210.0 lb

## 2010-12-24 DIAGNOSIS — I83009 Varicose veins of unspecified lower extremity with ulcer of unspecified site: Secondary | ICD-10-CM

## 2010-12-24 DIAGNOSIS — I83893 Varicose veins of bilateral lower extremities with other complications: Secondary | ICD-10-CM

## 2010-12-24 DIAGNOSIS — L97909 Non-pressure chronic ulcer of unspecified part of unspecified lower leg with unspecified severity: Secondary | ICD-10-CM

## 2010-12-24 NOTE — Progress Notes (Signed)
Subjective:     Patient ID: Leonard Lambert, male   DOB: 1955-05-30, 55 y.o.   MRN: [id number]  HPI this 55 year old male patient was scheduled for laser ablation of the right small saphenous vein for a nonhealing stasis ulcer today he previously underwent laser ablation of the right great saphenous vein on 11/19/2010. The ulcer remains unhealed and has not progressed. I imaged the small saphenous vein with the SonoSite ultrasound prior to the procedure and it appeared to me that the small saphenous vein was partially or totally thrombosed. He did have a connection with the great saphenous system which is totally thrombosed now. We canceled the procedure and had a formal venous duplex exam performed in our vascular lab today. The patient has been going to the [facility]   Review of Systems     Objective:   Physical Exam BP 160/88  Pulse 80  Resp 20  Ht 6\' 1"  (1.854 m)  Wt 210 lb (95.255 kg)  BMI 27.71 kg/m2   the ulceration today continues to be present just proximal to the right medial malleolus. There is an area of hyperpigmentation and excoriated skin measuring about 3 x 4 cm. He does have palpable arterial pulses distally.  Today I ordered a venous duplex exam which I reviewed and interpreted. There is no flow in the right great saphenous vein. There does continue to be reflux in the right small saphenous vein which is partially occluded. This is a change from previous studies. Assessment:     Partial-occlusion right small saphenous vein following laser ablation right great saphenous vein with persistent nonhealing ulcer    Plan:    will refer patient back to wound center for an Unna boot treatment to see if ulcer will heal. He will return in 3 months for repeat venous duplex exam. If ulcer is not healed and there continues to be reflux and slow in the right small saphenous vein then we will proceed with laser ablation right small saphenous vein

## 2010-12-24 NOTE — Progress Notes (Signed)
Addended by: Clementeen Hoof on: 12/24/2010 10:28 AM   Modules accepted: Orders

## 2010-12-24 NOTE — Progress Notes (Signed)
Rle SSV duplex performed @[facility]  12/24/2010

## 2010-12-31 ENCOUNTER — Encounter: Payer: 59 | Admitting: *Deleted

## 2010-12-31 ENCOUNTER — Ambulatory Visit: Payer: Managed Care, Other (non HMO) | Admitting: Vascular Surgery

## 2010-12-31 ENCOUNTER — Other Ambulatory Visit: Payer: Managed Care, Other (non HMO)

## 2011-01-02 NOTE — Procedures (Unsigned)
VASCULAR LAB EXAM  INDICATION:  Right lower extremity ulcer, varicose vein.  HISTORY: Diabetes:  No. Cardiac: Hypertension:  EXAM:  Right small saphenous vein duplex (limited).  IMPRESSION: 1. Partially thrombosed right small saphenous vein. 2. Venous reflux present throughout the right saphenous vein. 3. No perforator veins could be identified involving the right calf in     the area of the ulcer on the medial lateral aspect.  ___________________________________________ Quita Skye. Hart Rochester, M.D.  SH/MEDQ  D:  12/24/2010  T:  12/24/2010  Job:  [id number]

## 2011-03-25 ENCOUNTER — Ambulatory Visit: Payer: 59 | Admitting: Vascular Surgery

## 2011-03-25 ENCOUNTER — Other Ambulatory Visit: Payer: 59

## 2016-02-02 ENCOUNTER — Encounter (HOSPITAL_BASED_OUTPATIENT_CLINIC_OR_DEPARTMENT_OTHER): Payer: BLUE CROSS/BLUE SHIELD | Attending: Internal Medicine

## 2016-02-02 DIAGNOSIS — L97311 Non-pressure chronic ulcer of right ankle limited to breakdown of skin: Secondary | ICD-10-CM | POA: Insufficient documentation

## 2016-02-02 DIAGNOSIS — I87331 Chronic venous hypertension (idiopathic) with ulcer and inflammation of right lower extremity: Secondary | ICD-10-CM | POA: Insufficient documentation

## 2016-02-09 ENCOUNTER — Encounter (HOSPITAL_BASED_OUTPATIENT_CLINIC_OR_DEPARTMENT_OTHER): Payer: BLUE CROSS/BLUE SHIELD | Attending: Internal Medicine

## 2016-02-09 DIAGNOSIS — I87331 Chronic venous hypertension (idiopathic) with ulcer and inflammation of right lower extremity: Secondary | ICD-10-CM | POA: Diagnosis not present

## 2016-02-09 DIAGNOSIS — L97311 Non-pressure chronic ulcer of right ankle limited to breakdown of skin: Secondary | ICD-10-CM | POA: Insufficient documentation

## 2016-02-09 DIAGNOSIS — I10 Essential (primary) hypertension: Secondary | ICD-10-CM | POA: Insufficient documentation

## 2016-02-16 DIAGNOSIS — I87331 Chronic venous hypertension (idiopathic) with ulcer and inflammation of right lower extremity: Secondary | ICD-10-CM | POA: Diagnosis not present

## 2016-02-23 DIAGNOSIS — I87331 Chronic venous hypertension (idiopathic) with ulcer and inflammation of right lower extremity: Secondary | ICD-10-CM | POA: Diagnosis not present

## 2016-03-01 ENCOUNTER — Other Ambulatory Visit: Payer: Self-pay | Admitting: Internal Medicine

## 2016-03-01 DIAGNOSIS — I87331 Chronic venous hypertension (idiopathic) with ulcer and inflammation of right lower extremity: Secondary | ICD-10-CM | POA: Diagnosis not present

## 2016-03-07 ENCOUNTER — Other Ambulatory Visit: Payer: Self-pay | Admitting: Internal Medicine

## 2016-03-07 ENCOUNTER — Encounter (HOSPITAL_BASED_OUTPATIENT_CLINIC_OR_DEPARTMENT_OTHER): Payer: BLUE CROSS/BLUE SHIELD | Attending: Internal Medicine

## 2016-03-07 DIAGNOSIS — I10 Essential (primary) hypertension: Secondary | ICD-10-CM | POA: Insufficient documentation

## 2016-03-07 DIAGNOSIS — L97812 Non-pressure chronic ulcer of other part of right lower leg with fat layer exposed: Secondary | ICD-10-CM | POA: Diagnosis not present

## 2016-03-07 DIAGNOSIS — I87331 Chronic venous hypertension (idiopathic) with ulcer and inflammation of right lower extremity: Secondary | ICD-10-CM | POA: Diagnosis not present

## 2016-03-15 DIAGNOSIS — I87331 Chronic venous hypertension (idiopathic) with ulcer and inflammation of right lower extremity: Secondary | ICD-10-CM | POA: Diagnosis not present

## 2016-03-22 DIAGNOSIS — I87331 Chronic venous hypertension (idiopathic) with ulcer and inflammation of right lower extremity: Secondary | ICD-10-CM | POA: Diagnosis not present

## 2016-03-29 DIAGNOSIS — I87331 Chronic venous hypertension (idiopathic) with ulcer and inflammation of right lower extremity: Secondary | ICD-10-CM | POA: Diagnosis not present

## 2016-04-12 ENCOUNTER — Encounter (HOSPITAL_BASED_OUTPATIENT_CLINIC_OR_DEPARTMENT_OTHER): Payer: BLUE CROSS/BLUE SHIELD | Attending: Internal Medicine

## 2016-04-12 DIAGNOSIS — I10 Essential (primary) hypertension: Secondary | ICD-10-CM | POA: Diagnosis not present

## 2016-04-12 DIAGNOSIS — I87331 Chronic venous hypertension (idiopathic) with ulcer and inflammation of right lower extremity: Secondary | ICD-10-CM | POA: Diagnosis present

## 2016-04-12 DIAGNOSIS — L97812 Non-pressure chronic ulcer of other part of right lower leg with fat layer exposed: Secondary | ICD-10-CM | POA: Insufficient documentation

## 2016-04-26 DIAGNOSIS — I87331 Chronic venous hypertension (idiopathic) with ulcer and inflammation of right lower extremity: Secondary | ICD-10-CM | POA: Diagnosis not present

## 2016-05-10 ENCOUNTER — Encounter (HOSPITAL_BASED_OUTPATIENT_CLINIC_OR_DEPARTMENT_OTHER): Payer: BLUE CROSS/BLUE SHIELD | Attending: Internal Medicine

## 2016-05-10 DIAGNOSIS — L97311 Non-pressure chronic ulcer of right ankle limited to breakdown of skin: Secondary | ICD-10-CM | POA: Diagnosis not present

## 2016-05-10 DIAGNOSIS — I10 Essential (primary) hypertension: Secondary | ICD-10-CM | POA: Insufficient documentation

## 2016-05-10 DIAGNOSIS — I739 Peripheral vascular disease, unspecified: Secondary | ICD-10-CM | POA: Insufficient documentation

## 2016-05-10 DIAGNOSIS — I87331 Chronic venous hypertension (idiopathic) with ulcer and inflammation of right lower extremity: Secondary | ICD-10-CM | POA: Insufficient documentation

## 2016-05-24 DIAGNOSIS — I87331 Chronic venous hypertension (idiopathic) with ulcer and inflammation of right lower extremity: Secondary | ICD-10-CM | POA: Diagnosis not present

## 2016-06-07 ENCOUNTER — Encounter (HOSPITAL_BASED_OUTPATIENT_CLINIC_OR_DEPARTMENT_OTHER): Payer: BLUE CROSS/BLUE SHIELD | Attending: Internal Medicine

## 2016-06-07 DIAGNOSIS — L97812 Non-pressure chronic ulcer of other part of right lower leg with fat layer exposed: Secondary | ICD-10-CM | POA: Diagnosis not present

## 2016-06-07 DIAGNOSIS — I739 Peripheral vascular disease, unspecified: Secondary | ICD-10-CM | POA: Insufficient documentation

## 2016-06-07 DIAGNOSIS — I10 Essential (primary) hypertension: Secondary | ICD-10-CM | POA: Diagnosis not present

## 2016-06-07 DIAGNOSIS — I87331 Chronic venous hypertension (idiopathic) with ulcer and inflammation of right lower extremity: Secondary | ICD-10-CM | POA: Insufficient documentation

## 2016-06-21 DIAGNOSIS — I87331 Chronic venous hypertension (idiopathic) with ulcer and inflammation of right lower extremity: Secondary | ICD-10-CM | POA: Diagnosis not present

## 2017-02-19 ENCOUNTER — Other Ambulatory Visit: Payer: Self-pay | Admitting: Family Medicine

## 2017-02-19 DIAGNOSIS — R1011 Right upper quadrant pain: Secondary | ICD-10-CM

## 2017-02-28 ENCOUNTER — Ambulatory Visit
Admission: RE | Admit: 2017-02-28 | Discharge: 2017-02-28 | Disposition: A | Discharge status: Home / Self Care | Payer: BLUE CROSS/BLUE SHIELD | Source: Ambulatory Visit | Attending: Family Medicine | Admitting: Family Medicine

## 2017-02-28 DIAGNOSIS — R1011 Right upper quadrant pain: Secondary | ICD-10-CM

## 2017-03-24 ENCOUNTER — Other Ambulatory Visit: Payer: Self-pay | Admitting: General Surgery

## 2018-09-22 ENCOUNTER — Other Ambulatory Visit: Payer: Self-pay | Admitting: Family Medicine

## 2018-09-22 DIAGNOSIS — Z87891 Personal history of nicotine dependence: Secondary | ICD-10-CM

## 2018-09-28 ENCOUNTER — Other Ambulatory Visit: Payer: Self-pay | Admitting: Family Medicine

## 2018-09-28 DIAGNOSIS — Z87891 Personal history of nicotine dependence: Secondary | ICD-10-CM

## 2018-10-05 ENCOUNTER — Ambulatory Visit
Admission: RE | Admit: 2018-10-05 | Discharge: 2018-10-05 | Disposition: A | Discharge status: Home / Self Care | Payer: Managed Care, Other (non HMO) | Source: Ambulatory Visit | Attending: Family Medicine | Admitting: Family Medicine

## 2018-10-05 DIAGNOSIS — Z87891 Personal history of nicotine dependence: Secondary | ICD-10-CM

## 2018-11-20 IMAGING — US US ABDOMEN LIMITED
1 series · 14 of 25 positions shown · non-contrast
Comparison: None.

CLINICAL DATA: Right upper quadrant pain

EXAM:
ULTRASOUND ABDOMEN LIMITED RIGHT UPPER QUADRANT

[Series 1: us abdomen limited · 0.20mm/px · 14 of 35 slices shown]
[im 1/35]
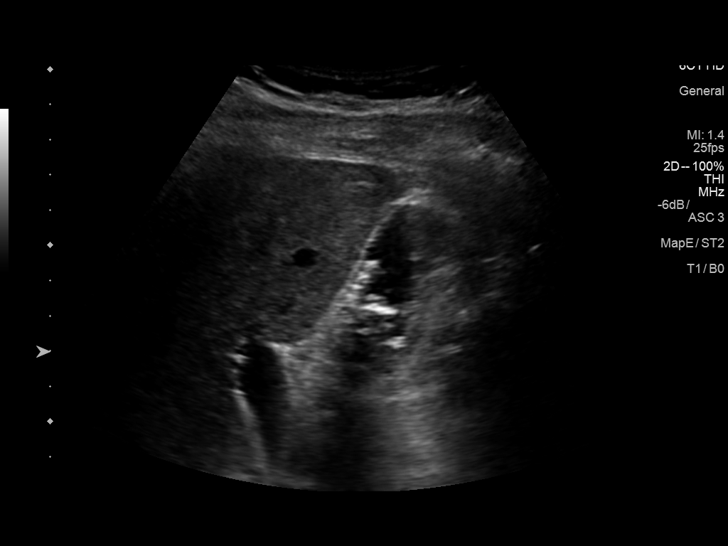
[im 3/35]
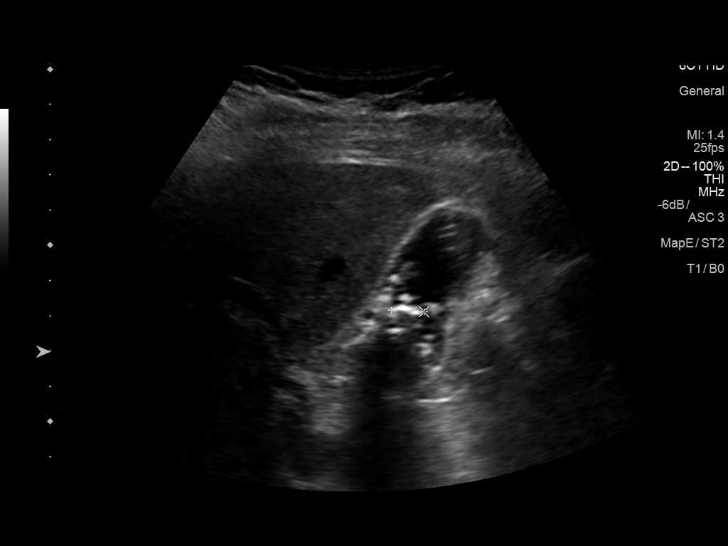
[im 6/35]
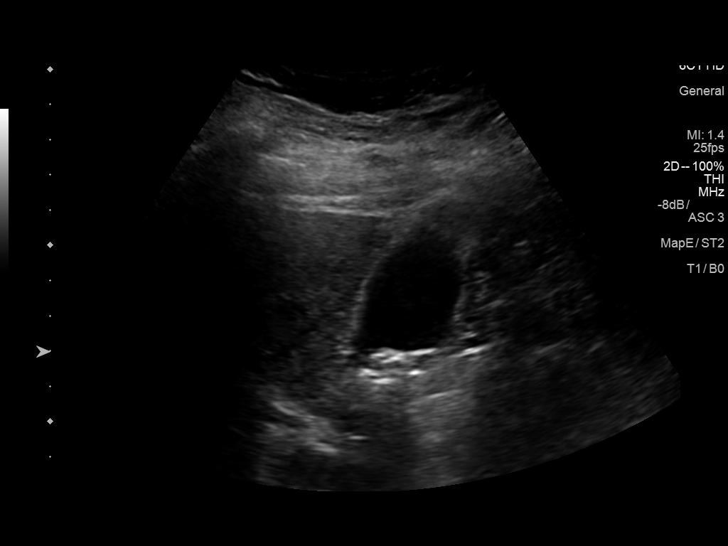
[im 9/35]
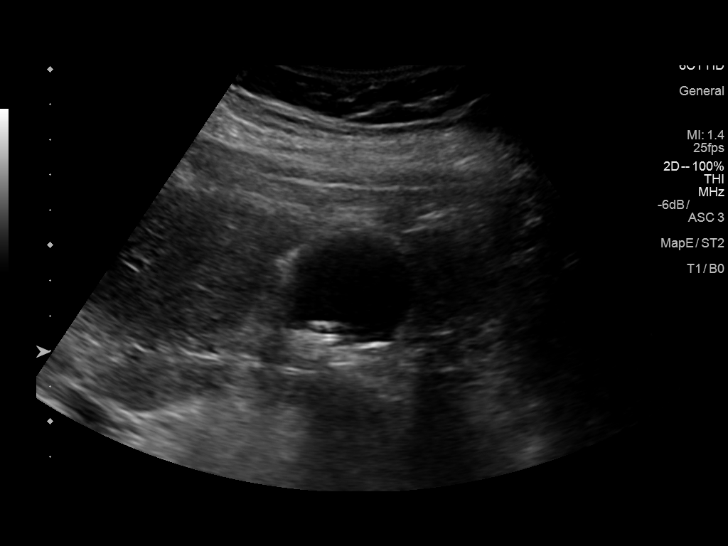
[im 12/35]
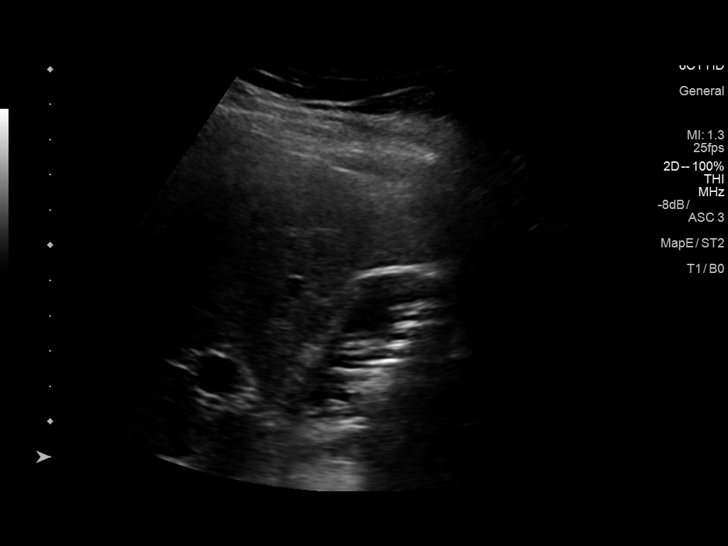
[im 13/35]
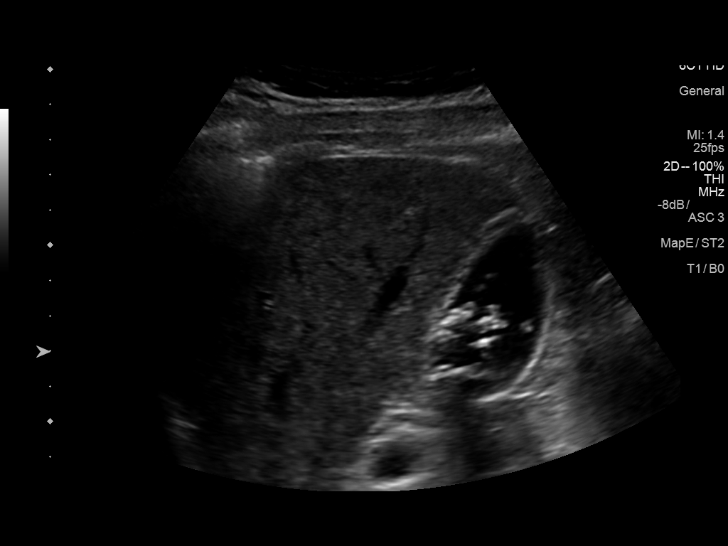
[im 16/35]
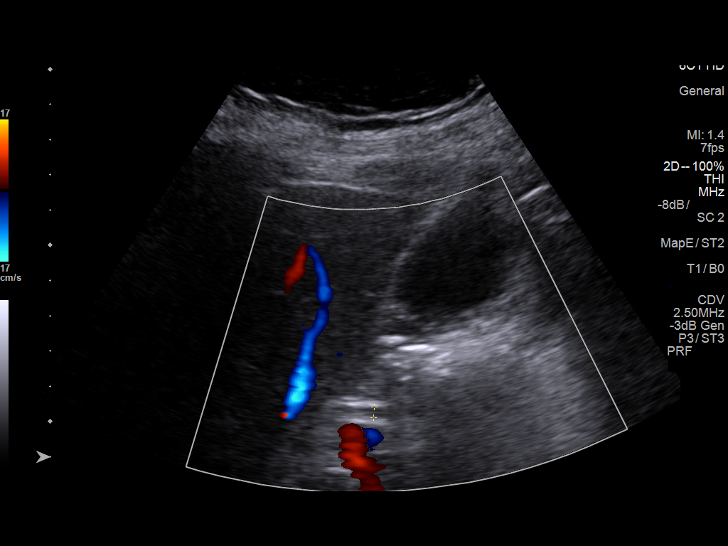
[im 19/35]
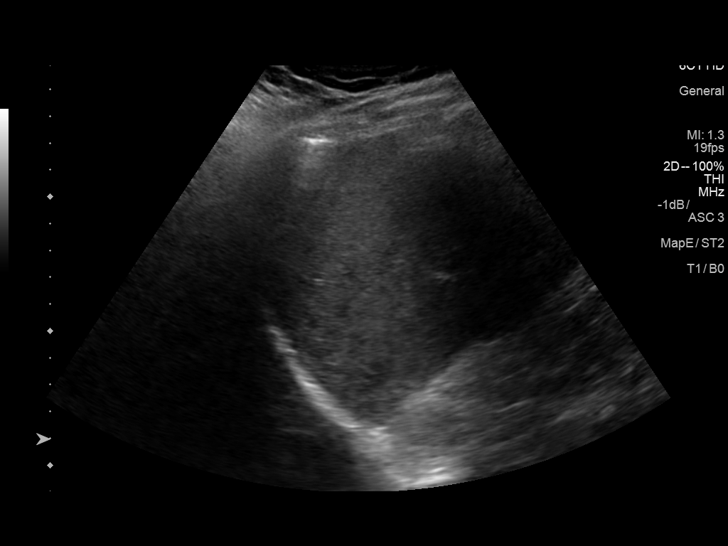
[im 22/35]
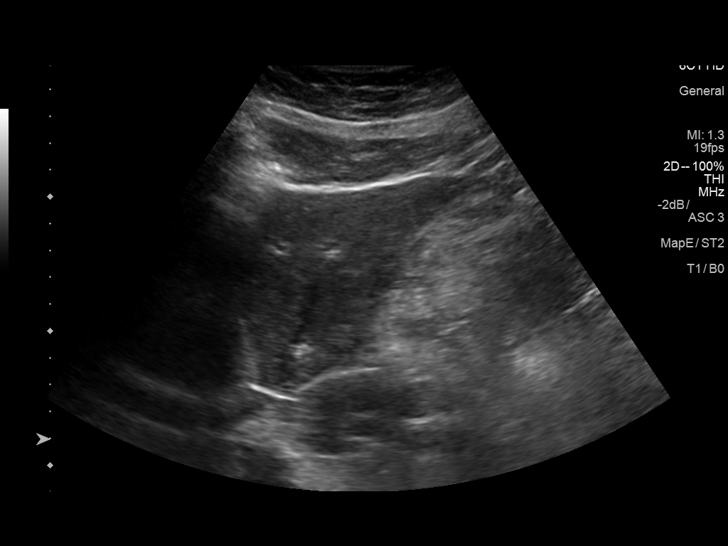
[im 23/35]
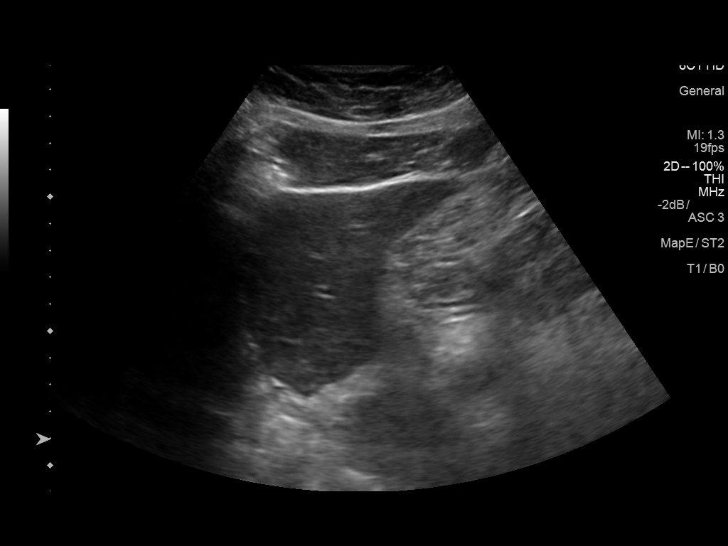
[im 26/35]
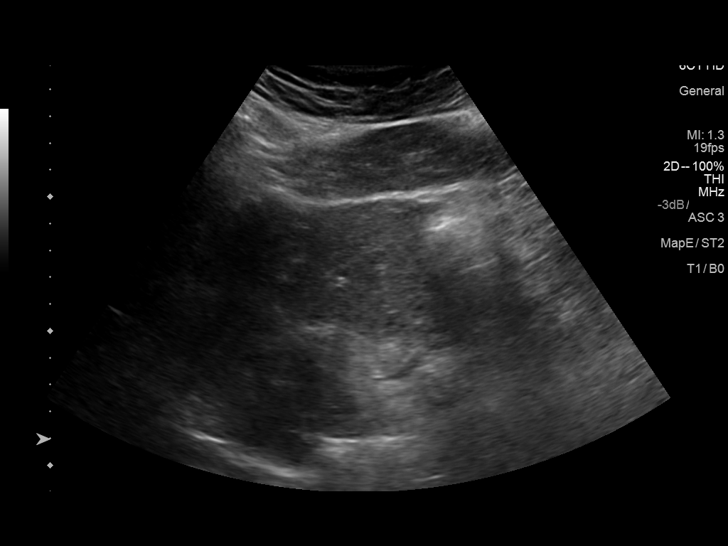
[im 29/35]
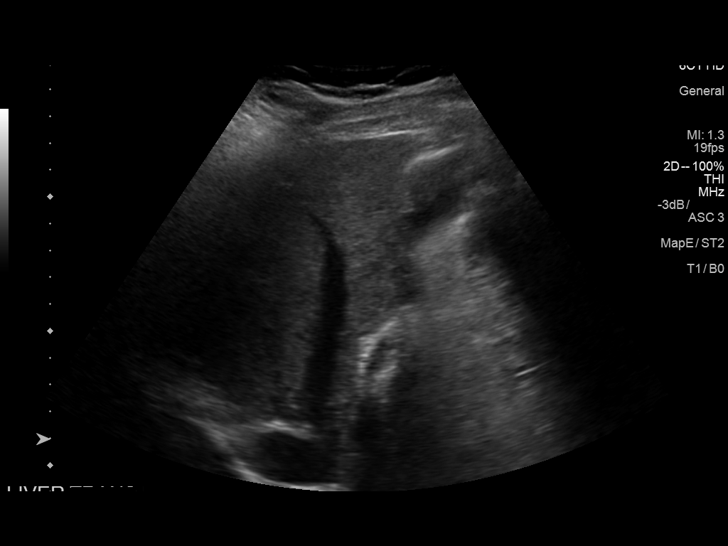
[im 32/35]
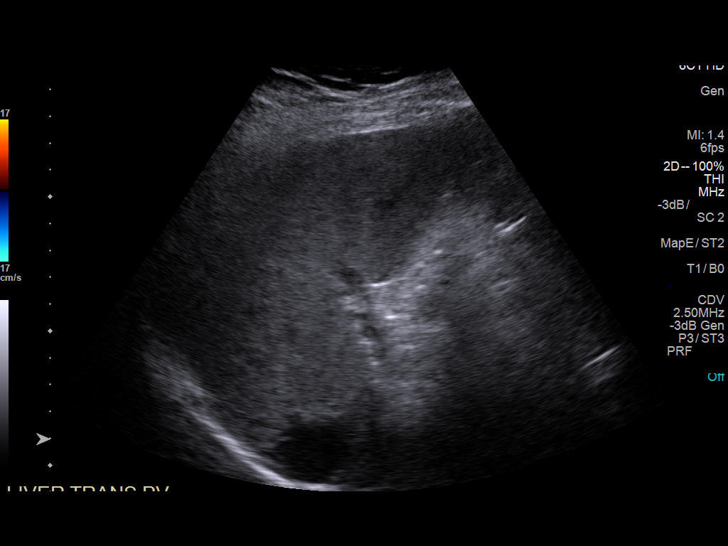
[im 35/35]
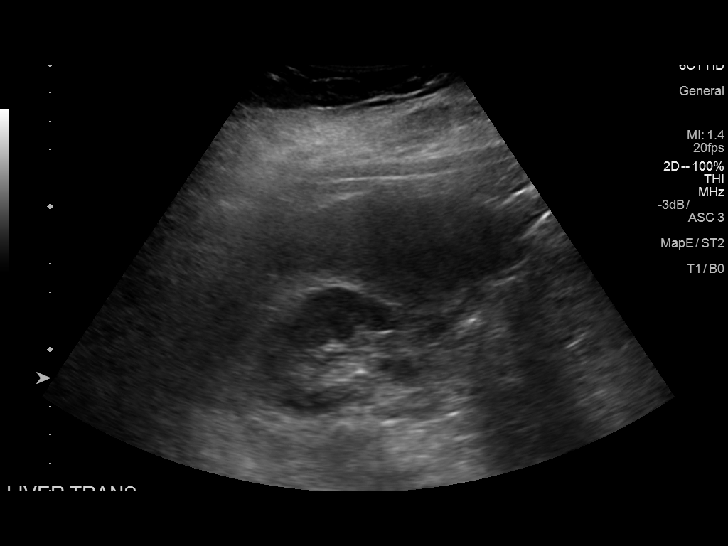

[14 of 25 positions shown; findings below may reference images not displayed]

FINDINGS: Gallbladder:

Multiple gallstones within the gallbladder, the largest 9 mm. No
wall thickening or sonographic Murphy's sign.

Common bile duct:

Diameter: Normal caliber, 3 mm

Liver:

No focal lesion identified. Within normal limits in parenchymal
echogenicity. Portal vein is patent on color Doppler imaging with
normal direction of blood flow towards the liver.
IMPRESSION: Cholelithiasis.  No sonographic evidence of acute cholecystitis.

## 2019-10-23 ENCOUNTER — Ambulatory Visit: Payer: Self-pay | Attending: Internal Medicine

## 2019-10-23 DIAGNOSIS — Z23 Encounter for immunization: Secondary | ICD-10-CM

## 2019-10-23 NOTE — Progress Notes (Signed)
   Covid-19 Vaccination Clinic  Name:  Amon Costilla    MRN: [id number] DOB: [birth year]  10/23/2019  Mr. Winship was observed post Covid-19 immunization for 15 minutes without incident. He was provided with Vaccine Information Sheet and instruction to access the V-Safe system.   Mr. Krysiak was instructed to call [phone] with any severe reactions post vaccine: Marland Kitchen Difficulty breathing  . Swelling of face and throat  . A fast heartbeat  . A bad rash all over body  . Dizziness and weakness

## 2020-01-16 DIAGNOSIS — Z1152 Encounter for screening for COVID-19: Secondary | ICD-10-CM | POA: Diagnosis not present

## 2020-08-12 IMAGING — US US AORTA
1 series · 14 of 14 positions shown · non-contrast
Comparison: None.

CLINICAL DATA: History of smoking.

EXAM:
ULTRASOUND OF ABDOMINAL AORTA
TECHNIQUE: Ultrasound examination of the abdominal aorta and proximal common
iliac arteries was performed to evaluate for aneurysm. Additional
color and Doppler images of the distal aorta were obtained to
document patency.

[Series 1: us aorta · 0.26mm/px · 14 of 14 slices shown]
[im 1/14]
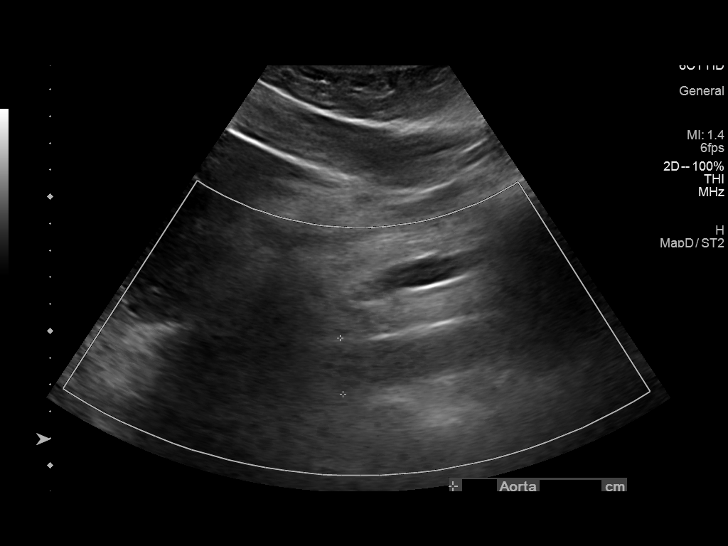
[im 2/14]
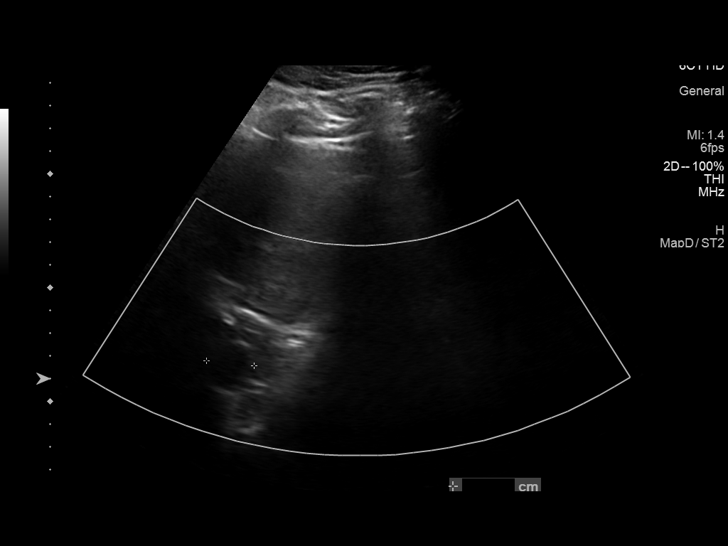
[im 3/14]
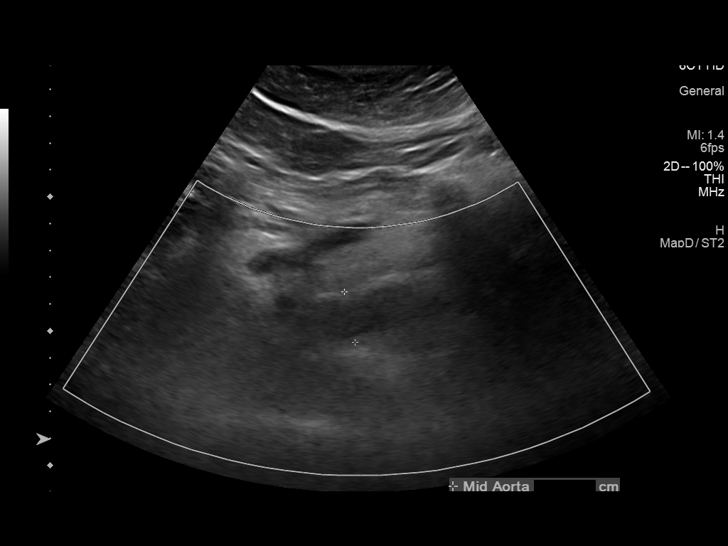
[im 4/14]
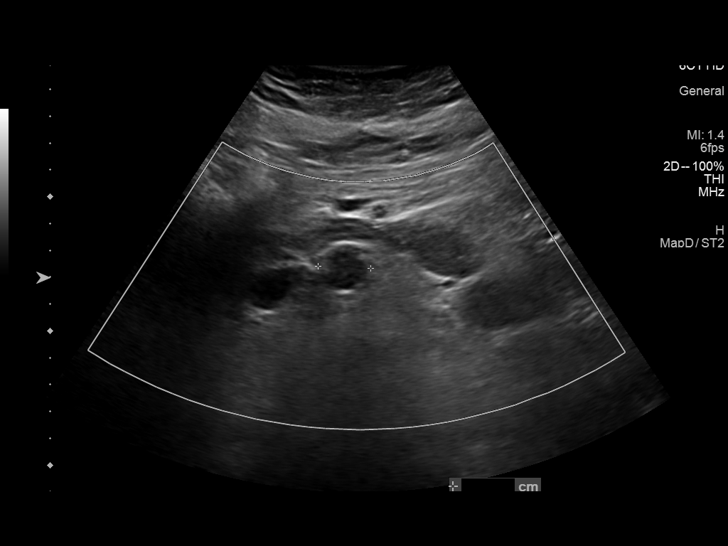
[im 5/14]
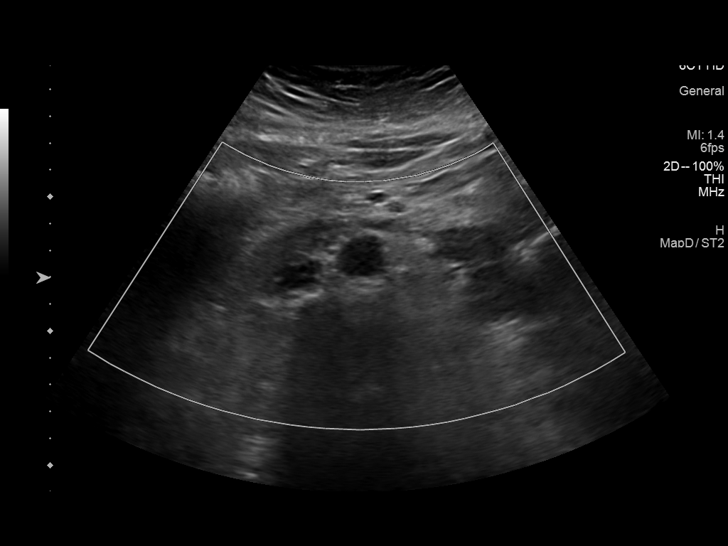
[im 6/14]
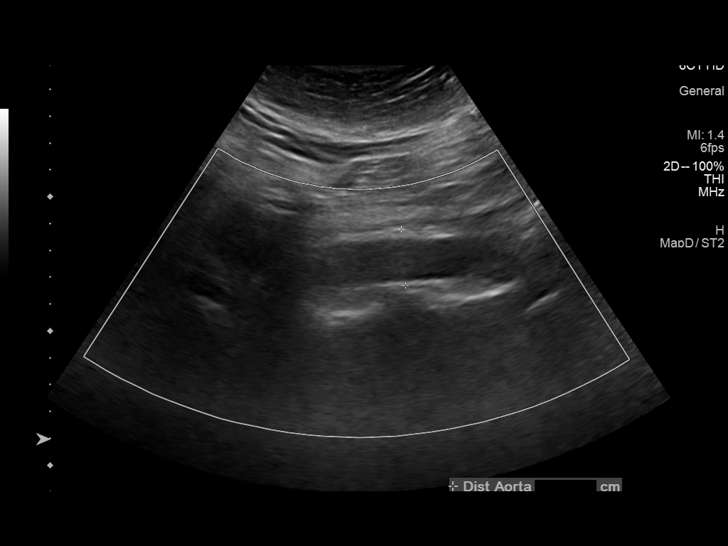
[im 7/14]
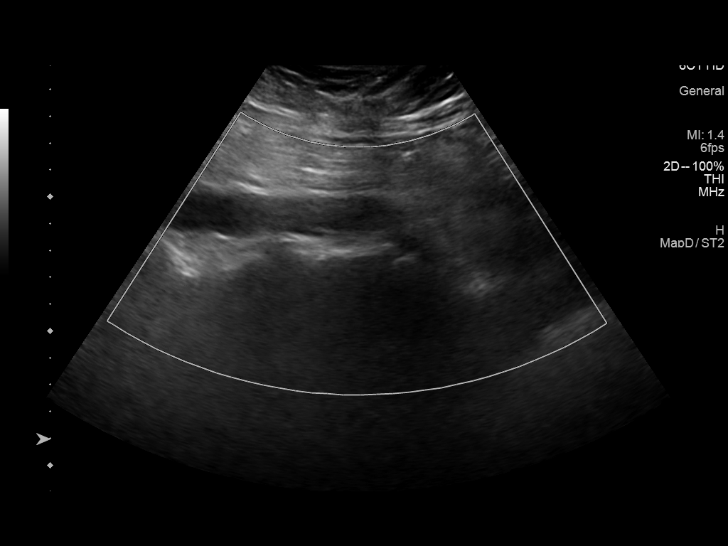
[im 8/14]
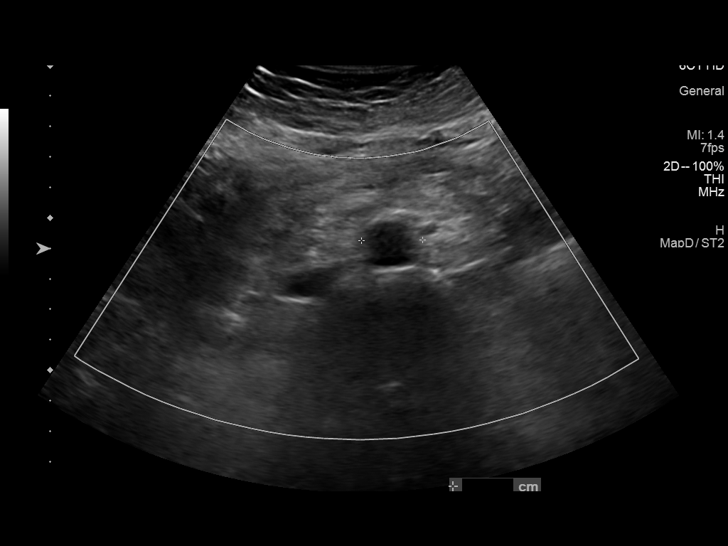
[im 9/14]
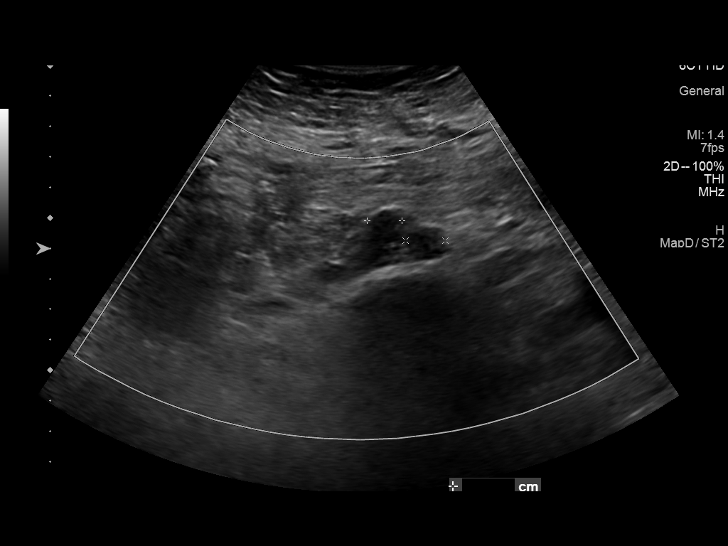
[im 10/14]
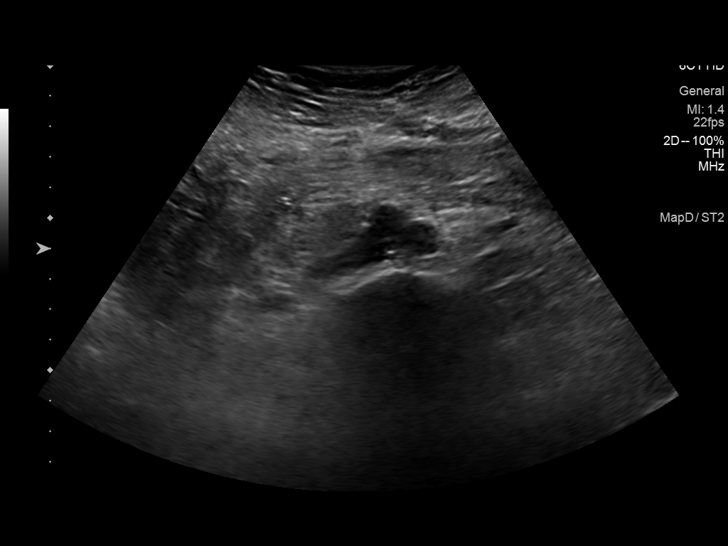
[im 11/14]
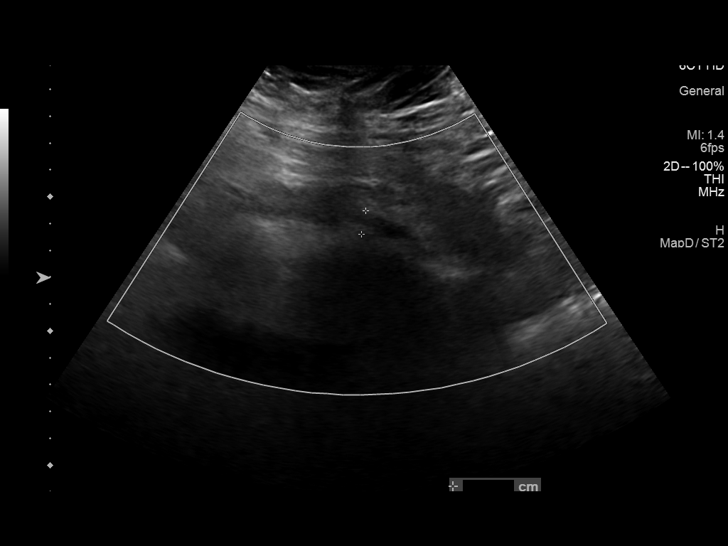
[im 12/14]
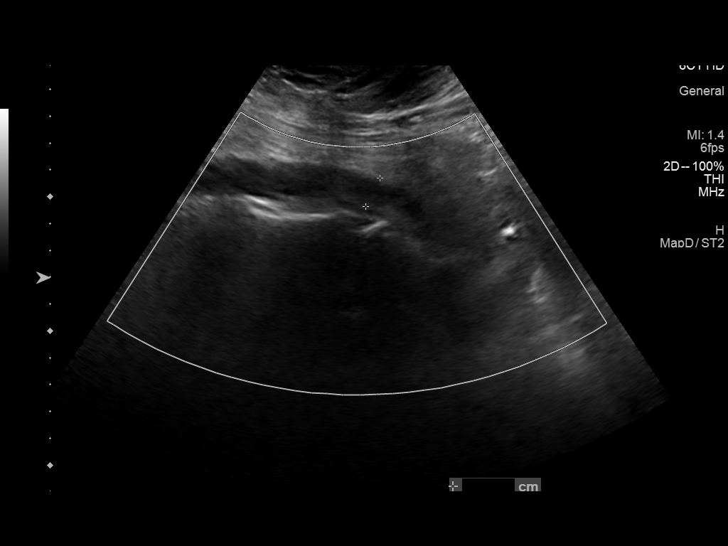
[im 13/14]
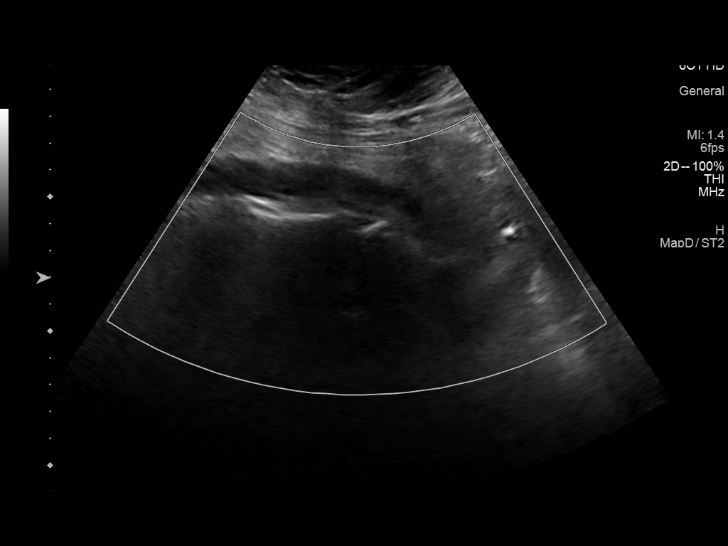
[im 14/14]
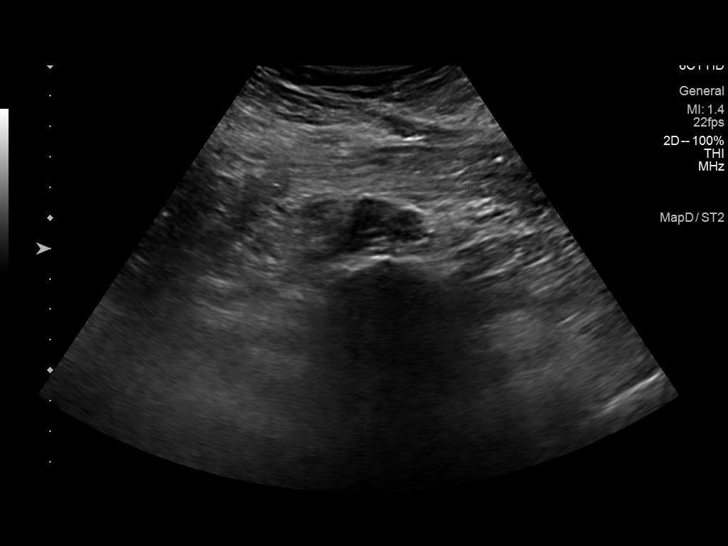

[14 of 14 positions shown; findings below may reference images not displayed]

FINDINGS: Abdominal aortic measurements as follows:

Proximal:  2.1 cm

Mid:  1.9 cm

Distal:  2.1 cm

Right common iliac artery: 1.1 cm

Left common iliac artery: 1.3 cm
IMPRESSION: Normal caliber abdominal aorta.

## 2021-01-16 DIAGNOSIS — Z23 Encounter for immunization: Secondary | ICD-10-CM | POA: Diagnosis not present

## 2021-08-23 DIAGNOSIS — H2513 Age-related nuclear cataract, bilateral: Secondary | ICD-10-CM | POA: Diagnosis not present

## 2021-08-23 DIAGNOSIS — H524 Presbyopia: Secondary | ICD-10-CM | POA: Diagnosis not present

## 2021-08-23 DIAGNOSIS — H5203 Hypermetropia, bilateral: Secondary | ICD-10-CM | POA: Diagnosis not present

## 2021-10-29 DIAGNOSIS — I10 Essential (primary) hypertension: Secondary | ICD-10-CM | POA: Diagnosis not present

## 2021-10-29 DIAGNOSIS — Z0000 Encounter for general adult medical examination without abnormal findings: Secondary | ICD-10-CM | POA: Diagnosis not present

## 2021-10-29 DIAGNOSIS — Z125 Encounter for screening for malignant neoplasm of prostate: Secondary | ICD-10-CM | POA: Diagnosis not present

## 2021-10-29 DIAGNOSIS — E786 Lipoprotein deficiency: Secondary | ICD-10-CM | POA: Diagnosis not present

## 2021-10-29 DIAGNOSIS — N529 Male erectile dysfunction, unspecified: Secondary | ICD-10-CM | POA: Diagnosis not present

## 2021-10-29 DIAGNOSIS — Z6829 Body mass index (BMI) 29.0-29.9, adult: Secondary | ICD-10-CM | POA: Diagnosis not present

## 2021-10-29 DIAGNOSIS — Z23 Encounter for immunization: Secondary | ICD-10-CM | POA: Diagnosis not present

## 2022-03-25 DIAGNOSIS — L570 Actinic keratosis: Secondary | ICD-10-CM | POA: Diagnosis not present

## 2022-03-25 DIAGNOSIS — L821 Other seborrheic keratosis: Secondary | ICD-10-CM | POA: Diagnosis not present

## 2022-03-25 DIAGNOSIS — L814 Other melanin hyperpigmentation: Secondary | ICD-10-CM | POA: Diagnosis not present

## 2022-03-25 DIAGNOSIS — D225 Melanocytic nevi of trunk: Secondary | ICD-10-CM | POA: Diagnosis not present

## 2022-11-04 DIAGNOSIS — Z125 Encounter for screening for malignant neoplasm of prostate: Secondary | ICD-10-CM | POA: Diagnosis not present

## 2022-11-04 DIAGNOSIS — I10 Essential (primary) hypertension: Secondary | ICD-10-CM | POA: Diagnosis not present

## 2022-11-04 DIAGNOSIS — N529 Male erectile dysfunction, unspecified: Secondary | ICD-10-CM | POA: Diagnosis not present

## 2022-11-04 DIAGNOSIS — Z23 Encounter for immunization: Secondary | ICD-10-CM | POA: Diagnosis not present

## 2022-11-04 DIAGNOSIS — E786 Lipoprotein deficiency: Secondary | ICD-10-CM | POA: Diagnosis not present

## 2022-11-04 DIAGNOSIS — Z0000 Encounter for general adult medical examination without abnormal findings: Secondary | ICD-10-CM | POA: Diagnosis not present

## 2022-11-04 DIAGNOSIS — Z6828 Body mass index (BMI) 28.0-28.9, adult: Secondary | ICD-10-CM | POA: Diagnosis not present

## 2023-01-13 DIAGNOSIS — H2513 Age-related nuclear cataract, bilateral: Secondary | ICD-10-CM | POA: Diagnosis not present

## 2023-01-13 DIAGNOSIS — H5203 Hypermetropia, bilateral: Secondary | ICD-10-CM | POA: Diagnosis not present

## 2023-02-17 DIAGNOSIS — D2261 Melanocytic nevi of right upper limb, including shoulder: Secondary | ICD-10-CM | POA: Diagnosis not present

## 2023-02-17 DIAGNOSIS — D2272 Melanocytic nevi of left lower limb, including hip: Secondary | ICD-10-CM | POA: Diagnosis not present

## 2023-02-17 DIAGNOSIS — D225 Melanocytic nevi of trunk: Secondary | ICD-10-CM | POA: Diagnosis not present

## 2023-02-17 DIAGNOSIS — L820 Inflamed seborrheic keratosis: Secondary | ICD-10-CM | POA: Diagnosis not present

## 2023-02-17 DIAGNOSIS — L570 Actinic keratosis: Secondary | ICD-10-CM | POA: Diagnosis not present

## 2023-02-17 DIAGNOSIS — L821 Other seborrheic keratosis: Secondary | ICD-10-CM | POA: Diagnosis not present

## 2023-02-17 DIAGNOSIS — L814 Other melanin hyperpigmentation: Secondary | ICD-10-CM | POA: Diagnosis not present

## 2023-02-17 DIAGNOSIS — L720 Epidermal cyst: Secondary | ICD-10-CM | POA: Diagnosis not present

## 2023-11-06 DIAGNOSIS — R351 Nocturia: Secondary | ICD-10-CM | POA: Diagnosis not present

## 2023-11-06 DIAGNOSIS — E786 Lipoprotein deficiency: Secondary | ICD-10-CM | POA: Diagnosis not present

## 2023-11-06 DIAGNOSIS — M25512 Pain in left shoulder: Secondary | ICD-10-CM | POA: Diagnosis not present

## 2023-11-06 DIAGNOSIS — H9190 Unspecified hearing loss, unspecified ear: Secondary | ICD-10-CM | POA: Diagnosis not present

## 2023-11-06 DIAGNOSIS — Z23 Encounter for immunization: Secondary | ICD-10-CM | POA: Diagnosis not present

## 2023-11-06 DIAGNOSIS — I10 Essential (primary) hypertension: Secondary | ICD-10-CM | POA: Diagnosis not present

## 2023-11-06 DIAGNOSIS — Z0000 Encounter for general adult medical examination without abnormal findings: Secondary | ICD-10-CM | POA: Diagnosis not present

## 2023-12-08 ENCOUNTER — Ambulatory Visit (INDEPENDENT_AMBULATORY_CARE_PROVIDER_SITE_OTHER): Admitting: Student

## 2023-12-08 ENCOUNTER — Ambulatory Visit (INDEPENDENT_AMBULATORY_CARE_PROVIDER_SITE_OTHER)

## 2023-12-08 DIAGNOSIS — M19012 Primary osteoarthritis, left shoulder: Secondary | ICD-10-CM | POA: Diagnosis not present

## 2023-12-08 DIAGNOSIS — M25512 Pain in left shoulder: Secondary | ICD-10-CM | POA: Diagnosis not present

## 2023-12-08 DIAGNOSIS — G8929 Other chronic pain: Secondary | ICD-10-CM

## 2023-12-08 NOTE — Progress Notes (Signed)
 Chief Complaint: Left shoulder pain    Discussed the use of AI scribe software for clinical note transcription with the patient, who gave verbal consent to proceed.  History of Present Illness Leonard Lambert is a 68 year old right-hand-dominant male who presents with left shoulder pain. He has had intermittent left anterior shoulder pain for about a year, worsened by shoulder extension and internal rotation, such as when putting on a shirt or jacket. He notes occasional popping with brief twinges of pain. The pain is sometimes more noticeable at night and can disrupt sleep. He has not pursued prior treatment such as physical therapy and avoids painful movements. He also notes similar discomfort in the left hip and knee.  Denies use of pain medication.   Surgical History:   None  PMH/PSH/Family History/Social History/Meds/Allergies:    Past Medical History:  Diagnosis Date   Ulcer    No past surgical history on file. Social History   Socioeconomic History   Marital status: Married    Spouse name: Not on file   Number of children: Not on file   Years of education: Not on file   Highest education level: Not on file  Occupational History   Not on file  Tobacco Use   Smoking status: Former    Current packs/day: 0.00    Types: Cigarettes    Quit date: 01/07/1985    Years since quitting: 38.9   Smokeless tobacco: Not on file  Substance and Sexual Activity   Alcohol use: Not on file    Comment: drinks beer occassionally   Drug use: Not on file   Sexual activity: Not on file  Other Topics Concern   Not on file  Social History Narrative   Not on file   Social Drivers of Health   Financial Resource Strain: Not on file  Food Insecurity: Not on file  Transportation Needs: Not on file  Physical Activity: Not on file  Stress: Not on file  Social Connections: Not on file   No family history on file. No Known Allergies No current  outpatient medications on file.   No current facility-administered medications for this visit.   No results found.  Review of Systems:   A ROS was performed including pertinent positives and negatives as documented in the HPI.  Physical Exam :   Constitutional: NAD and appears stated age Neurological: Alert and oriented Psych: Appropriate affect and cooperative There were no vitals taken for this visit.   Comprehensive Musculoskeletal Exam:    Left shoulder exam demonstrates mild anterior glenohumeral joint tenderness without lateral deltoid or AC joint tenderness.  Active range of motion of the left shoulder to 150 degrees forward flexion, 40 degrees external rotation, and internal rotation L2 compared to 160/50/L2 contralaterally.  Pain with impingement tests.  Negative empty can, belly press, and cross body adduction tests.  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      Assessment & Plan Left shoulder pain  Chronic left shoulder pain is due to mild osteoarthritis and involvement of shoulder impingement. Symptoms are mild with good function, so conservative management is preferred. Home exercise resources for rotator cuff strengthening with resistance bands were provided.  He should monitor symptoms and report any worsening pain or interference with activities. Consider a cortisone injection if symptoms worsen.       I personally saw and evaluated the patient, and participated in the management and treatment plan.  Leonce Reveal, PA-C Orthopedics
